# Patient Record
Sex: Male | Born: 1950 | Race: Black or African American | Hispanic: No | Marital: Single | Smoking: Current every day smoker
Health system: Southern US, Community
[De-identification: ages and names within clinical notes are randomized; demographics above are authoritative.]

## PROBLEM LIST (undated history)

## (undated) DIAGNOSIS — I10 Essential (primary) hypertension: Secondary | ICD-10-CM

## (undated) DIAGNOSIS — J449 Chronic obstructive pulmonary disease, unspecified: Secondary | ICD-10-CM

## (undated) DIAGNOSIS — D649 Anemia, unspecified: Secondary | ICD-10-CM

## (undated) DIAGNOSIS — K219 Gastro-esophageal reflux disease without esophagitis: Secondary | ICD-10-CM

## (undated) HISTORY — DX: Gastro-esophageal reflux disease without esophagitis: K21.9

## (undated) HISTORY — DX: Chronic obstructive pulmonary disease, unspecified: J44.9

## (undated) HISTORY — PX: COLONOSCOPY: SHX174

## (undated) HISTORY — PX: MULTIPLE TOOTH EXTRACTIONS: SHX2053

## (undated) HISTORY — DX: Anemia, unspecified: D64.9

---

## 1999-09-18 ENCOUNTER — Emergency Department (HOSPITAL_COMMUNITY): Admission: EM | Admit: 1999-09-18 | Discharge: 1999-09-18 | Payer: Self-pay | Admitting: Emergency Medicine

## 1999-09-18 ENCOUNTER — Encounter: Payer: Self-pay | Admitting: Emergency Medicine

## 1999-09-22 ENCOUNTER — Ambulatory Visit (HOSPITAL_BASED_OUTPATIENT_CLINIC_OR_DEPARTMENT_OTHER): Admission: RE | Admit: 1999-09-22 | Discharge: 1999-09-22 | Payer: Self-pay | Admitting: Oral Surgery

## 2000-02-01 ENCOUNTER — Ambulatory Visit (HOSPITAL_COMMUNITY): Admission: RE | Admit: 2000-02-01 | Discharge: 2000-02-01 | Payer: Self-pay | Admitting: Oral Surgery

## 2000-02-01 ENCOUNTER — Encounter: Payer: Self-pay | Admitting: Oral Surgery

## 2009-01-29 ENCOUNTER — Emergency Department (HOSPITAL_COMMUNITY): Admission: EM | Admit: 2009-01-29 | Discharge: 2009-01-29 | Payer: Self-pay | Admitting: Emergency Medicine

## 2011-03-28 LAB — COMPREHENSIVE METABOLIC PANEL
ALT: 28 U/L (ref 0–53)
AST: 31 U/L (ref 0–37)
Albumin: 3.7 g/dL (ref 3.5–5.2)
Alkaline Phosphatase: 84 U/L (ref 39–117)
BUN: 7 mg/dL (ref 6–23)
CO2: 30 mEq/L (ref 19–32)
Calcium: 8.9 mg/dL (ref 8.4–10.5)
Chloride: 102 mEq/L (ref 96–112)
Creatinine, Ser: 1.06 mg/dL (ref 0.4–1.5)
GFR calc Af Amer: >60 mL/min (ref >60)
GFR calc non Af Amer: >60 mL/min (ref >60)
Glucose, Bld: 104 mg/dL — ABNORMAL HIGH (ref 70–99)
Potassium: 4.3 mEq/L (ref 3.5–5.1)
Sodium: 140 mEq/L (ref 135–145)
Total Bilirubin: 0.5 mg/dL (ref 0.3–1.2)
Total Protein: 6.9 g/dL (ref 6.0–8.3)

## 2011-03-28 LAB — CBC
HCT: 40.5 % (ref 39.0–52.0)
Hemoglobin: 13.5 g/dL (ref 13.0–17.0)
MCHC: 33.3 g/dL (ref 30.0–36.0)
MCV: 91.5 fL (ref 78.0–100.0)
Platelets: 187 10*3/uL (ref 150–400)
RBC: 4.43 MIL/uL (ref 4.22–5.81)
RDW: 14.5 % (ref 11.5–15.5)
WBC: 6.9 10*3/uL (ref 4.0–10.5)

## 2011-03-28 LAB — DIFFERENTIAL
Basophils Absolute: 0 10*3/uL (ref 0.0–0.1)
Basophils Relative: 0 % (ref 0–1)
Lymphocytes Relative: 44 % (ref 12–46)
Neutro Abs: 3.2 10*3/uL (ref 1.7–7.7)

## 2011-03-28 LAB — URINALYSIS, ROUTINE W REFLEX MICROSCOPIC
Ketones, ur: NEGATIVE mg/dL
Nitrite: NEGATIVE
Specific Gravity, Urine: 1.014 (ref 1.005–1.030)
Urobilinogen, UA: 0.2 mg/dL (ref 0.0–1.0)
pH: 5.5 (ref 5.0–8.0)

## 2011-03-28 LAB — ETHANOL: Alcohol, Ethyl (B): 234 mg/dL — ABNORMAL HIGH (ref 0–10)

## 2013-03-27 ENCOUNTER — Encounter (HOSPITAL_COMMUNITY): Payer: Self-pay

## 2013-03-27 ENCOUNTER — Emergency Department (INDEPENDENT_AMBULATORY_CARE_PROVIDER_SITE_OTHER): Payer: Self-pay

## 2013-03-27 ENCOUNTER — Emergency Department (INDEPENDENT_AMBULATORY_CARE_PROVIDER_SITE_OTHER)
Admission: EM | Admit: 2013-03-27 | Discharge: 2013-03-27 | Disposition: A | Discharge status: Home / Self Care | Payer: Self-pay | Source: Home / Self Care | Attending: Family Medicine | Admitting: Family Medicine

## 2013-03-27 DIAGNOSIS — J410 Simple chronic bronchitis: Secondary | ICD-10-CM

## 2013-03-27 DIAGNOSIS — Z720 Tobacco use: Secondary | ICD-10-CM

## 2013-03-27 DIAGNOSIS — J40 Bronchitis, not specified as acute or chronic: Secondary | ICD-10-CM

## 2013-03-27 MED ORDER — DEXTROMETHORPHAN POLISTIREX 30 MG/5ML PO LQCR: 60.0000 mg | Freq: Two times a day (BID) | ORAL | Status: DC

## 2013-03-27 MED ORDER — DOXYCYCLINE HYCLATE 100 MG PO CAPS: 100.0000 mg | ORAL_CAPSULE | Freq: Two times a day (BID) | ORAL | Status: DC

## 2013-03-27 NOTE — ED Notes (Addendum)
Cough for 2 weeks; no improvement w OTC medications; yellow/green secretions

## 2013-03-27 NOTE — ED Provider Notes (Signed)
History     CSN: [id number]  Arrival date & time 03/27/13  1638   First MD Initiated Contact with Patient 03/27/13 1708      Chief Complaint  Patient presents with  . Cough    (Consider location/radiation/quality/duration/timing/severity/associated sxs/prior treatment) Patient is a 62 y.o. male presenting with cough. The history is provided by the patient and the spouse.  Cough Cough characteristics:  Productive Sputum characteristics:  Green and yellow Severity:  Moderate Onset quality:  Gradual Duration:  2 weeks Progression:  Unchanged Chronicity:  Recurrent Smoker: yes   Context: smoke exposure   Associated symptoms: shortness of breath   Associated symptoms: no chills, no fever, no rhinorrhea and no wheezing     History reviewed. No pertinent past medical history.  History reviewed. No pertinent past surgical history.  History reviewed. No pertinent family history.  History  Substance Use Topics  . Smoking status: Current Every Day Smoker  . Smokeless tobacco: Not on file  . Alcohol Use: Yes      Review of Systems  Constitutional: Negative for fever and chills.  HENT: Negative for rhinorrhea.   Respiratory: Positive for cough and shortness of breath. Negative for wheezing.   Cardiovascular: Negative.   Gastrointestinal: Negative.     Allergies  Review of patient's allergies indicates not on file.  Home Medications   Current Outpatient Rx  Name  Route  Sig  Dispense  Refill  . dextromethorphan (DELSYM) 30 MG/5ML liquid   Oral   Take 10 mLs (60 mg total) by mouth 2 (two) times daily. For cough   89 mL   1   . doxycycline (VIBRAMYCIN) 100 MG capsule   Oral   Take 1 capsule (100 mg total) by mouth 2 (two) times daily.   20 capsule   0     BP 163/98  Pulse 84  Temp(Src) 98.6 F (37 C) (Oral)  Resp 16  SpO2 95%  Physical Exam  Nursing note and vitals reviewed. Constitutional: He is oriented to person, place, and time. He appears  well-developed and well-nourished.  HENT:  Head: Normocephalic.  Right Ear: External ear normal.  Left Ear: External ear normal.  Mouth/Throat: Oropharynx is clear and moist.  Eyes: Pupils are equal, round, and reactive to light.  Neck: Normal range of motion. Neck supple.  Cardiovascular: Normal rate and regular rhythm.   Pulmonary/Chest: He has decreased breath sounds. He has wheezes in the right upper field. He has rhonchi.  Lymphadenopathy:    He has no cervical adenopathy.  Neurological: He is alert and oriented to person, place, and time.  Skin: Skin is warm and dry.    ED Course  Procedures (including critical care time)  Labs Reviewed - No data to display Dg Chest 2 View  03/27/2013  *RADIOLOGY REPORT*  Clinical Data: Cough for 2 weeks, smoking history, shortness of  CHEST - 2 VIEW  Comparison: Chest x-ray of 01/29/2009  Findings: Slightly more prominent right hilum most likely is due to rotation, with no abnormality noted on the lateral view.  Follow-up chest x-ray is recommended.  No focal infiltrate or effusion is seen.  Mediastinal contours are stable.  The heart is within upper limits normal.  There are degenerative changes throughout the thoracic spine.  IMPRESSION:  No definite active process.  Slightly more prominent right hilum most likely rotational.  Consider follow-up chest x-ray if warranted clinically.   Original Report Authenticated By: Dwyane Dee, M.D.      1.  Bronchitis due to tobacco use       MDM  X-rays reviewed and report per radiologist.         Linna Hoff, MD 03/27/13 2020

## 2018-01-09 ENCOUNTER — Emergency Department (HOSPITAL_COMMUNITY)
Admission: EM | Admit: 2018-01-09 | Discharge: 2018-01-09 | Disposition: A | Discharge status: Home / Self Care | Payer: Commercial Managed Care - HMO | Attending: Emergency Medicine | Admitting: Emergency Medicine

## 2018-01-09 ENCOUNTER — Encounter (HOSPITAL_COMMUNITY): Payer: Self-pay

## 2018-01-09 ENCOUNTER — Other Ambulatory Visit: Payer: Self-pay

## 2018-01-09 ENCOUNTER — Emergency Department (HOSPITAL_COMMUNITY): Payer: Commercial Managed Care - HMO

## 2018-01-09 DIAGNOSIS — I10 Essential (primary) hypertension: Secondary | ICD-10-CM | POA: Insufficient documentation

## 2018-01-09 DIAGNOSIS — F17200 Nicotine dependence, unspecified, uncomplicated: Secondary | ICD-10-CM | POA: Diagnosis not present

## 2018-01-09 DIAGNOSIS — R1013 Epigastric pain: Secondary | ICD-10-CM | POA: Insufficient documentation

## 2018-01-09 HISTORY — DX: Essential (primary) hypertension: I10

## 2018-01-09 LAB — CBC
HCT: 31.5 % — ABNORMAL LOW (ref 39.0–52.0)
Hemoglobin: 10.1 g/dL — ABNORMAL LOW (ref 13.0–17.0)
MCH: 30.8 pg (ref 26.0–34.0)
MCHC: 32.1 g/dL (ref 30.0–36.0)
MCV: 96 fL (ref 78.0–100.0)
Platelets: 289 10*3/uL (ref 150–400)
RBC: 3.28 MIL/uL — AB (ref 4.22–5.81)
RDW: 21.8 % — ABNORMAL HIGH (ref 11.5–15.5)
WBC: 6.9 10*3/uL (ref 4.0–10.5)

## 2018-01-09 LAB — COMPREHENSIVE METABOLIC PANEL
ALK PHOS: 73 U/L (ref 38–126)
ALT: 17 U/L (ref 17–63)
ANION GAP: 8 (ref 5–15)
AST: 22 U/L (ref 15–41)
Albumin: 3.8 g/dL (ref 3.5–5.0)
BILIRUBIN TOTAL: 1.3 mg/dL — AB (ref 0.3–1.2)
BUN: 5 mg/dL — ABNORMAL LOW (ref 6–20)
CALCIUM: 8.9 mg/dL (ref 8.9–10.3)
CO2: 25 mmol/L (ref 22–32)
Chloride: 105 mmol/L (ref 101–111)
Creatinine, Ser: 0.8 mg/dL (ref 0.61–1.24)
Glucose, Bld: 99 mg/dL (ref 65–99)
Potassium: 4.2 mmol/L (ref 3.5–5.1)
Sodium: 138 mmol/L (ref 135–145)
TOTAL PROTEIN: 6.9 g/dL (ref 6.5–8.1)

## 2018-01-09 LAB — URINALYSIS, ROUTINE W REFLEX MICROSCOPIC
BILIRUBIN URINE: NEGATIVE
Glucose, UA: NEGATIVE mg/dL
Hgb urine dipstick: NEGATIVE
Ketones, ur: NEGATIVE mg/dL
LEUKOCYTES UA: NEGATIVE
NITRITE: NEGATIVE
Protein, ur: NEGATIVE mg/dL
Specific Gravity, Urine: 1.02 (ref 1.005–1.030)
pH: 5 (ref 5.0–8.0)

## 2018-01-09 LAB — LIPASE, BLOOD: Lipase: 32 U/L (ref 11–51)

## 2018-01-09 MED ORDER — GI COCKTAIL ~~LOC~~
30.0000 mL | Freq: Once | ORAL | Status: AC
  Administered 2018-01-09: 30 mL via ORAL
  Filled 2018-01-09: qty 30

## 2018-01-09 NOTE — Discharge Instructions (Signed)
Please read instructions below. Schedule an appointment with the gastroenterologist to follow-up on your ongoing symptoms. Begin taking Prilosec daily again. Avoid spicy, acidic, fatty, and greasy foods, alcohol, and medications such as Advil/ibuprofen, aspirin, Goody's Powder, Aleve. Do not lay down for at least 45 minutes following a meal. Return to ER for new or concerning symptoms.

## 2018-01-09 NOTE — ED Triage Notes (Signed)
Pt reports RUQ, LUQ abdominal pain x 3 days. Denies n/v/diarrhea. Endorses constipation at times but last BM yesterday. Reports pain is not relieved by antiacids.

## 2018-01-09 NOTE — ED Notes (Signed)
Pt states he would prefer to wait for IV. Will inform provider

## 2018-01-09 NOTE — ED Notes (Signed)
Patient transported to Ultrasound 

## 2018-01-09 NOTE — ED Provider Notes (Signed)
[facility] Provider Note   CSN: [id number] Arrival date & time: 01/09/18  1119     History   Chief Complaint Chief Complaint  Patient presents with  . Abdominal Pain    HPI Vincent Marquez is a 67 y.o. male with past medical history of hypertension, presenting to the ED for multiple months of worsening epigastric abdominal pain.  Patient states he has been told in the past that he has GERD years ago, which she has been treating with Prilosec over-the-counter.  He states the past few months symptoms have been worsening, described as a burning and sharp pain in his upper abdomen that radiates around to his back.  Patient states symptoms are exacerbated by greasy and spicy meals, and alcohol intake.  He also states symptoms are worse when laying flat, and it has been keeping him up the past few nights.  Reports associated flatulence.  Denies nausea, vomiting, diarrhea, fever, chills, urinary symptoms, or other complaints. No PCP. Has not seen GI specialist for sx.  The history is provided by the patient.    Past Medical History:  Diagnosis Date  . Hypertension     There are no active problems to display for this patient.   History reviewed. No pertinent surgical history.     Home Medications    Prior to Admission medications   Medication Sig Start Date End Date Taking? Authorizing Provider  Alpha-D-Galactosidase Satira Mccallum) TABS Take 4 tablets by mouth daily.   Yes [provider]  Cal Carb-Mag Hydrox-Simeth (ROLAIDS ADVANCED) 1000-200-40 MG CHEW Chew 1-2 tablets by mouth 4 (four) times daily as needed (for acid indigestion).   Yes [provider]  calcium carbonate (TUMS - DOSED IN MG ELEMENTAL CALCIUM) 500 MG chewable tablet Chew 1-2 tablets by mouth as needed for indigestion or heartburn.   Yes [provider]  omeprazole (PRILOSEC OTC) 20 MG tablet Take 20 mg by mouth 2 (two) times daily as needed (for acid  indigestion).    Yes [provider]  simethicone (GAS-X EXTRA STRENGTH) 125 MG chewable tablet Chew 125 mg by mouth every 6 (six) hours as needed for flatulence.   Yes [provider]  Sodium Bicarbonate POWD Mix 1-2 teaspoonsful into 6 ounces of water and drink as needed for indigestion   Yes [provider]  dextromethorphan (DELSYM) 30 MG/5ML liquid Take 10 mLs (60 mg total) by mouth 2 (two) times daily. For cough Patient not taking: Reported on 01/09/2018 03/27/13   Billy Fischer, MD  doxycycline (VIBRAMYCIN) 100 MG capsule Take 1 capsule (100 mg total) by mouth 2 (two) times daily. Patient not taking: Reported on 01/09/2018 03/27/13   Billy Fischer, MD    Family History No family history on file.  Social History Social History   Tobacco Use  . Smoking status: Current Every Day Smoker  . Smokeless tobacco: Never Used  Substance Use Topics  . Alcohol use: Yes    Frequency: Never  . Drug use: No     Allergies   Patient has no known allergies.   Review of Systems Review of Systems  Constitutional: Negative for appetite change and fever.  Respiratory: Negative for shortness of breath.   Cardiovascular: Negative for chest pain.  Gastrointestinal: Positive for abdominal pain. Negative for constipation, diarrhea, nausea and vomiting.  Genitourinary: Negative for dysuria and frequency.  All other systems reviewed and are negative.    Physical Exam Updated Vital Signs BP (!) 134/97  Pulse 83   Temp 98.3 F (36.8 C) (Oral)   Resp 16   Ht 5\' 7"  (1.702 m)   Wt 79.4 kg (175 lb)   SpO2 100%   BMI 27.41 kg/m   Physical Exam  Constitutional: He appears well-developed and well-nourished. He does not appear ill. No distress.  HENT:  Head: Normocephalic and atraumatic.  Mouth/Throat: Oropharynx is clear and moist.  Eyes: Conjunctivae are normal.  Cardiovascular: Normal rate, regular rhythm, normal heart sounds and intact distal pulses.    Pulmonary/Chest: Effort normal and breath sounds normal.  Abdominal: Soft. Normal appearance and bowel sounds are normal. He exhibits no distension and no mass (no pulsatile masses). There is tenderness in the right upper quadrant, epigastric area and left upper quadrant. There is no rigidity, no rebound, no guarding and negative Murphy's sign.  Neurological: He is alert.  Skin: Skin is warm.  Psychiatric: He has a normal mood and affect. His behavior is normal.  Nursing note and vitals reviewed.    ED Treatments / Results  Labs (all labs ordered are listed, but only abnormal results are displayed) Labs Reviewed  COMPREHENSIVE METABOLIC PANEL - Abnormal; Notable for the following components:      Result Value   BUN 5 (*)    Total Bilirubin 1.3 (*)    All other components within normal limits  CBC - Abnormal; Notable for the following components:   RBC 3.28 (*)    Hemoglobin 10.1 (*)    HCT 31.5 (*)    RDW 21.8 (*)    All other components within normal limits  LIPASE, BLOOD  URINALYSIS, ROUTINE W REFLEX MICROSCOPIC    EKG  EKG Interpretation None       Radiology US Abdomen Limited Ruq  Result Date: 01/09/2018 CLINICAL DATA:  Epigastric pain EXAM: ULTRASOUND ABDOMEN LIMITED RIGHT UPPER QUADRANT COMPARISON:  None. FINDINGS: Gallbladder: Contracted. The patient was not NPO for the study. No visible stones or sonographic Murphy sign. Gallbladder wall borderline in thickness at 3 mm, likely related to contracted state Common bile duct: Diameter: Normal caliber, 3 mm Liver: No focal lesion identified. Within normal limits in parenchymal echogenicity. Portal vein is patent on color Doppler imaging with normal direction of blood flow towards the liver. IMPRESSION: Unremarkable right upper quadrant ultrasound. Electronically Signed   By: Rolm Baptise M.D.   On: 01/09/2018 18:19    Procedures Procedures (including critical care time)  Medications Ordered in ED Medications  gi  cocktail (Maalox,Lidocaine,Donnatal) (30 mLs Oral Given 01/09/18 1734)     Initial Impression / Assessment and Plan / ED Course  I have reviewed the triage vital signs and the nursing notes.  Pertinent labs & imaging results that were available during my care of the patient were reviewed by me and considered in my medical decision making (see chart for details).     Patient with past medical history of GERD, presenting to the ED for worsening symptoms.  Describes symptoms as postprandial, intermittently sharp epigastric abdominal pain, and reflux that is worse when laying flat at night.  Abdominal exam without peritoneal signs, mild epigastric and bilateral upper quadrant tenderness, negative Murphy's.  Labs unremarkable.  Right upper quadrant ultrasound done and neg for cholelithiasis.  Suspect symptoms are secondary to GERD versus gastritis.  GI cocktail given in ED, and patient reported significant relief of symptoms.  Discussed diet modifications, and recommend patient begin taking Prilosec daily.  Also provided GI referral for evaluation of ongoing symptoms.  Well-Appearing,  not distressed, safe for discharge at this time.  Patient discussed with Dr. Ralene Bathe.  Discussed results, findings, treatment and follow up. Patient advised of return precautions. Patient verbalized understanding and agreed with plan.  Final Clinical Impressions(s) / ED Diagnoses   Final diagnoses:  Epigastric abdominal pain    ED Discharge Orders    None       Robinson, [place] N, PA-C 01/10/18 0102    Quintella Reichert, MD 01/10/18 [phone]

## 2019-01-15 ENCOUNTER — Ambulatory Visit (INDEPENDENT_AMBULATORY_CARE_PROVIDER_SITE_OTHER): Payer: Commercial Managed Care - HMO

## 2019-01-15 ENCOUNTER — Ambulatory Visit (HOSPITAL_COMMUNITY)
Admission: EM | Admit: 2019-01-15 | Discharge: 2019-01-15 | Disposition: A | Discharge status: Home / Self Care | Payer: Commercial Managed Care - HMO | Attending: Emergency Medicine | Admitting: Emergency Medicine

## 2019-01-15 ENCOUNTER — Encounter (HOSPITAL_COMMUNITY): Payer: Self-pay | Admitting: Emergency Medicine

## 2019-01-15 DIAGNOSIS — S2231XA Fracture of one rib, right side, initial encounter for closed fracture: Secondary | ICD-10-CM | POA: Diagnosis present

## 2019-01-15 MED ORDER — HYDROCODONE-ACETAMINOPHEN 5-325 MG PO TABS: 1.0000 | ORAL_TABLET | Freq: Four times a day (QID) | ORAL | 0 refills | Status: DC | PRN

## 2019-01-15 MED ORDER — IBUPROFEN 600 MG PO TABS: 600.0000 mg | ORAL_TABLET | Freq: Four times a day (QID) | ORAL | 0 refills | Status: DC | PRN

## 2019-01-15 MED ORDER — AEROCHAMBER PLUS MISC: 2 refills | Status: DC

## 2019-01-15 MED ORDER — ALBUTEROL SULFATE HFA 108 (90 BASE) MCG/ACT IN AERS: 1.0000 | INHALATION_SPRAY | Freq: Four times a day (QID) | RESPIRATORY_TRACT | 0 refills | Status: AC | PRN

## 2019-01-15 NOTE — ED Notes (Signed)
Patient able to ambulate independently  

## 2019-01-15 NOTE — ED Provider Notes (Signed)
HPI  SUBJECTIVE:  Vincent Marquez is a 68 y.o. male who presents with 2 days of sharp, throbbing, stabbing right anterior rib pain after leaning forward to pick up his girlfriend.  States that he heard a "pop".  He reports worsening coughing, wheezing, dyspnea on exertion secondary to pain.  He states that his baseline shortness of breath is getting better.  Denies any direct trauma to the chest.  No fevers, hemoptysis.  No antipyretic in the past 4 to 6 hours.  He has tried 800 mg of ibuprofen twice daily with some improvement in symptoms.  Symptoms are worse with coughing, inspiration, torso rotation, sneezing.  He has a past medical history of hypertension, for 40-year pack history of smoking.  No history of pneumothorax, asthma, emphysema, COPD, although he is on an inhaled steroid.  No history of diabetes.  PMD: Dr. Synthia Innocent.   Past Medical History:  Diagnosis Date  . Hypertension     History reviewed. No pertinent surgical history.  History reviewed. No pertinent family history.  Social History   Tobacco Use  . Smoking status: Current Every Day Smoker  . Smokeless tobacco: Never Used  Substance Use Topics  . Alcohol use: Yes    Frequency: Never  . Drug use: No    No current facility-administered medications for this encounter.   Current Outpatient Medications:  .  Fluticasone-Umeclidin-Vilant (TRELEGY ELLIPTA) 100-62.5-25 MCG/INH AEPB, Inhale into the lungs., Disp: , Rfl:  .  ondansetron (ZOFRAN) 4 MG tablet, Take 4 mg by mouth every 8 (eight) hours as needed for nausea or vomiting., Disp: , Rfl:  .  albuterol (PROVENTIL HFA;VENTOLIN HFA) 108 (90 Base) MCG/ACT inhaler, Inhale 1-2 puffs into the lungs every 6 (six) hours as needed for wheezing or shortness of breath., Disp: 1 Inhaler, Rfl: 0 .  HYDROcodone-acetaminophen (NORCO/VICODIN) 5-325 MG tablet, Take 1-2 tablets by mouth every 6 (six) hours as needed for moderate pain or severe pain., Disp: 12 tablet, Rfl: 0 .   ibuprofen (ADVIL,MOTRIN) 600 MG tablet, Take 1 tablet (600 mg total) by mouth every 6 (six) hours as needed., Disp: 30 tablet, Rfl: 0 .  omeprazole (PRILOSEC OTC) 20 MG tablet, Take 20 mg by mouth 2 (two) times daily as needed (for acid indigestion). , Disp: , Rfl:  .  Spacer/Aero-Holding Chambers (AEROCHAMBER PLUS) inhaler, Use as instructed, Disp: 1 each, Rfl: 2  No Known Allergies   ROS  As noted in HPI.   Physical Exam  BP (!) 166/89 (BP Location: Left Arm)   Pulse 81   Temp 97.8 F (36.6 C) (Oral)   Resp 18   SpO2 96%   Constitutional: Well developed, well nourished, no acute distress Eyes:  EOMI, conjunctiva normal bilaterally HENT: Normocephalic, atraumatic,mucus membranes moist Respiratory: Normal inspiratory effort.  Lungs clear bilaterally.  Positive tenderness to lower anterior ribs and along the midaxillary line.  No bruising.  No paradoxical chest wall motion.  Patient able to take a full deep breath in. Cardiovascular: Normal rate regular rhythm no murmurs rubs or gallops GI: nondistended skin: No rash, skin intact Musculoskeletal: no deformities Neurologic: Alert & oriented x 3, no focal neuro deficits Psychiatric: Speech and behavior appropriate   ED Course   Medications - No data to display  Orders Placed This Encounter  Procedures  . DG Ribs Unilateral W/Chest Right    Standing Status:   Standing    Number of Occurrences:   1    Order Specific Question:   Reason  for Exam (SYMPTOM  OR DIAGNOSIS REQUIRED)    Answer:   tenderness anterior chest wall r/o fx, ptx, pna  . Incentive spirometry RT    Standing Status:   Standing    Number of Occurrences:   1    No results found for this or any previous visit (from the past 24 hour(s)). Dg Ribs Unilateral W/chest Right  Result Date: 01/15/2019 CLINICAL DATA:  Right anterior rib pain and tenderness of the anterior chest wall. EXAM: RIGHT RIBS AND CHEST - 3+ VIEW COMPARISON:  03/27/2013 and 01/29/2009  FINDINGS: There are multiple old right rib fractures including the anterior aspects of the right seventh eighth and ninth ribs and the posterior aspects of the right fifth and sixth ribs. There is area of cortical disruption of the anterolateral aspect of the right fifth rib which could be acute. Heart size and vascularity are normal. Lungs are clear. Soft tissue prominence at the right lung apex medially is felt to be tortuous brachiocephalic vessels, unchanged since 2010. IMPRESSION: 1. Multiple old right rib fractures. Possible hairline fracture of the anterolateral aspect of the right fifth rib although this is well above the marked area of tenderness. Electronically Signed   By: Lorriane Shire M.D.   On: 01/15/2019 10:49    ED Clinical Impression  Closed fracture of one rib of right side, initial encounter   ED Assessment/Plan  Checking rib series.  [place] Narcotic database reviewed for this patient, and feel that the risk/benefit ratio today is favorable for proceeding with a prescription for controlled substance.  No opiate prescriptions in 2 years.  Reviewed imaging independently.  Multiple old rib fractures.  Possible hairline fracture of the right fifth rib.  No pneumothorax.   see radiology report for full details.  Home with ibuprofen and a Tylenol containing product 3 or 4 times a day as needed for pain.  Ibuprofen with 1 g of Tylenol for mild to moderate pain, ibuprofen with Norco for severe pain.  Will send home with an albuterol inhaler with a spacer.  Also incentive spirometer.  He will need to follow-up with his primary care physician in several days if not getting any better, to the ER if he gets worse.  Discussed labs, imaging, MDM, treatment plan, and plan for follow-up with patient. Discussed sn/sx that should prompt return to the ED. patient agrees with plan.   Meds ordered this encounter  Medications  . HYDROcodone-acetaminophen (NORCO/VICODIN) 5-325 MG tablet    Sig: Take  1-2 tablets by mouth every 6 (six) hours as needed for moderate pain or severe pain.    Dispense:  12 tablet    Refill:  0  . ibuprofen (ADVIL,MOTRIN) 600 MG tablet    Sig: Take 1 tablet (600 mg total) by mouth every 6 (six) hours as needed.    Dispense:  30 tablet    Refill:  0  . albuterol (PROVENTIL HFA;VENTOLIN HFA) 108 (90 Base) MCG/ACT inhaler    Sig: Inhale 1-2 puffs into the lungs every 6 (six) hours as needed for wheezing or shortness of breath.    Dispense:  1 Inhaler    Refill:  0  . Spacer/Aero-Holding Chambers (AEROCHAMBER PLUS) inhaler    Sig: Use as instructed    Dispense:  1 each    Refill:  2    *This clinic note was created using Dragon dictation software. Therefore, there may be occasional mistakes despite careful proofreading.   ?   Melynda Ripple, MD 01/16/19 [phone]

## 2019-01-15 NOTE — ED Triage Notes (Signed)
Pt presents to [facility] for assessment of right rib pain after he felt a "pop" on Friday while dancing with his partner.  States pain has not been relieved by OTC medicines.  States hx of 40+ years of smoking

## 2019-01-15 NOTE — Discharge Instructions (Addendum)
600 mg of ibuprofen and a Tylenol containing product 3 or 4 times a day as needed for pain.  Ibuprofen with 1 g of regularTylenol for mild to moderate pain, ibuprofen with 1-2 Norco for severe pain.  Do not take the Norco if you are taking Tylenol, as they both have Tylenol in them and too much Tylenol can hurt your liver.  2 puffs from your albuterol using your spacer every 4-6 hours as needed for coughing, wheezing.  Use the incentive spirometer 3 times an hour.  This will help keep your lungs open and prevent a pneumonia.

## 2019-09-02 ENCOUNTER — Other Ambulatory Visit: Payer: Self-pay | Admitting: Gastroenterology

## 2019-09-02 DIAGNOSIS — R1010 Upper abdominal pain, unspecified: Secondary | ICD-10-CM

## 2019-09-12 ENCOUNTER — Ambulatory Visit
Admission: RE | Admit: 2019-09-12 | Discharge: 2019-09-12 | Disposition: A | Discharge status: Home / Self Care | Payer: Medicare HMO | Source: Ambulatory Visit | Attending: Gastroenterology | Admitting: Gastroenterology

## 2019-09-12 DIAGNOSIS — R1010 Upper abdominal pain, unspecified: Secondary | ICD-10-CM

## 2019-12-24 ENCOUNTER — Encounter: Payer: Self-pay | Admitting: Physician Assistant

## 2020-01-01 ENCOUNTER — Ambulatory Visit: Payer: Medicare HMO | Admitting: Physician Assistant

## 2020-01-22 ENCOUNTER — Encounter: Payer: Self-pay | Admitting: Internal Medicine

## 2020-03-10 ENCOUNTER — Encounter: Payer: Self-pay | Admitting: Internal Medicine

## 2021-01-07 DIAGNOSIS — Z01812 Encounter for preprocedural laboratory examination: Secondary | ICD-10-CM | POA: Diagnosis not present

## 2021-01-11 DIAGNOSIS — E663 Overweight: Secondary | ICD-10-CM | POA: Diagnosis not present

## 2021-01-11 DIAGNOSIS — Z791 Long term (current) use of non-steroidal anti-inflammatories (NSAID): Secondary | ICD-10-CM | POA: Diagnosis not present

## 2021-01-11 DIAGNOSIS — K219 Gastro-esophageal reflux disease without esophagitis: Secondary | ICD-10-CM | POA: Diagnosis not present

## 2021-01-11 DIAGNOSIS — N400 Enlarged prostate without lower urinary tract symptoms: Secondary | ICD-10-CM | POA: Diagnosis not present

## 2021-01-11 DIAGNOSIS — K259 Gastric ulcer, unspecified as acute or chronic, without hemorrhage or perforation: Secondary | ICD-10-CM | POA: Diagnosis not present

## 2021-01-11 DIAGNOSIS — Z720 Tobacco use: Secondary | ICD-10-CM | POA: Diagnosis not present

## 2021-01-11 DIAGNOSIS — R030 Elevated blood-pressure reading, without diagnosis of hypertension: Secondary | ICD-10-CM | POA: Diagnosis not present

## 2021-01-11 DIAGNOSIS — Z8249 Family history of ischemic heart disease and other diseases of the circulatory system: Secondary | ICD-10-CM | POA: Diagnosis not present

## 2021-01-11 DIAGNOSIS — G8929 Other chronic pain: Secondary | ICD-10-CM | POA: Diagnosis not present

## 2021-01-12 DIAGNOSIS — K635 Polyp of colon: Secondary | ICD-10-CM | POA: Diagnosis not present

## 2021-01-12 DIAGNOSIS — D509 Iron deficiency anemia, unspecified: Secondary | ICD-10-CM | POA: Diagnosis not present

## 2021-01-12 DIAGNOSIS — K621 Rectal polyp: Secondary | ICD-10-CM | POA: Diagnosis not present

## 2021-01-12 DIAGNOSIS — K2930 Chronic superficial gastritis without bleeding: Secondary | ICD-10-CM | POA: Diagnosis not present

## 2021-01-12 DIAGNOSIS — K5520 Angiodysplasia of colon without hemorrhage: Secondary | ICD-10-CM | POA: Diagnosis not present

## 2021-01-12 DIAGNOSIS — K648 Other hemorrhoids: Secondary | ICD-10-CM | POA: Diagnosis not present

## 2021-01-12 DIAGNOSIS — D123 Benign neoplasm of transverse colon: Secondary | ICD-10-CM | POA: Diagnosis not present

## 2021-01-12 DIAGNOSIS — Z1211 Encounter for screening for malignant neoplasm of colon: Secondary | ICD-10-CM | POA: Diagnosis not present

## 2021-01-12 DIAGNOSIS — D125 Benign neoplasm of sigmoid colon: Secondary | ICD-10-CM | POA: Diagnosis not present

## 2021-01-12 DIAGNOSIS — K2951 Unspecified chronic gastritis with bleeding: Secondary | ICD-10-CM | POA: Diagnosis not present

## 2021-01-12 DIAGNOSIS — R1013 Epigastric pain: Secondary | ICD-10-CM | POA: Diagnosis not present

## 2021-01-17 DIAGNOSIS — D123 Benign neoplasm of transverse colon: Secondary | ICD-10-CM | POA: Diagnosis not present

## 2021-01-17 DIAGNOSIS — K2951 Unspecified chronic gastritis with bleeding: Secondary | ICD-10-CM | POA: Diagnosis not present

## 2021-01-17 DIAGNOSIS — K621 Rectal polyp: Secondary | ICD-10-CM | POA: Diagnosis not present

## 2021-01-17 DIAGNOSIS — D125 Benign neoplasm of sigmoid colon: Secondary | ICD-10-CM | POA: Diagnosis not present

## 2021-01-17 DIAGNOSIS — K635 Polyp of colon: Secondary | ICD-10-CM | POA: Diagnosis not present

## 2021-01-18 ENCOUNTER — Ambulatory Visit: Payer: Medicare HMO | Admitting: Medical

## 2021-03-09 DIAGNOSIS — Z20822 Contact with and (suspected) exposure to covid-19: Secondary | ICD-10-CM | POA: Diagnosis not present

## 2021-05-18 ENCOUNTER — Ambulatory Visit: Payer: Medicare HMO | Admitting: Family Medicine

## 2021-06-14 ENCOUNTER — Ambulatory Visit (INDEPENDENT_AMBULATORY_CARE_PROVIDER_SITE_OTHER): Payer: Medicare HMO | Admitting: Primary Care

## 2021-07-19 ENCOUNTER — Other Ambulatory Visit: Payer: Self-pay | Admitting: Family Medicine

## 2021-07-19 DIAGNOSIS — N5089 Other specified disorders of the male genital organs: Secondary | ICD-10-CM

## 2021-07-25 ENCOUNTER — Other Ambulatory Visit: Payer: Medicare Other

## 2021-08-05 ENCOUNTER — Other Ambulatory Visit: Payer: Medicare Other

## 2021-08-11 ENCOUNTER — Ambulatory Visit
Admission: RE | Admit: 2021-08-11 | Discharge: 2021-08-11 | Disposition: A | Discharge status: Home / Self Care | Payer: Medicare Other | Source: Ambulatory Visit | Attending: Family Medicine | Admitting: Family Medicine

## 2021-08-11 DIAGNOSIS — N5089 Other specified disorders of the male genital organs: Secondary | ICD-10-CM

## 2021-09-21 ENCOUNTER — Other Ambulatory Visit: Payer: Self-pay | Admitting: Student

## 2021-09-21 DIAGNOSIS — R109 Unspecified abdominal pain: Secondary | ICD-10-CM

## 2021-09-21 DIAGNOSIS — K219 Gastro-esophageal reflux disease without esophagitis: Secondary | ICD-10-CM

## 2021-09-28 ENCOUNTER — Other Ambulatory Visit: Payer: Self-pay | Admitting: Student

## 2021-09-28 DIAGNOSIS — R109 Unspecified abdominal pain: Secondary | ICD-10-CM

## 2021-10-17 ENCOUNTER — Ambulatory Visit
Admission: RE | Admit: 2021-10-17 | Discharge: 2021-10-17 | Disposition: A | Discharge status: Home / Self Care | Payer: Medicare Other | Source: Ambulatory Visit | Attending: Student | Admitting: Student

## 2021-10-17 DIAGNOSIS — K219 Gastro-esophageal reflux disease without esophagitis: Secondary | ICD-10-CM

## 2021-10-17 DIAGNOSIS — R109 Unspecified abdominal pain: Secondary | ICD-10-CM

## 2021-10-17 MED ORDER — IOPAMIDOL (ISOVUE-300) INJECTION 61%
100.0000 mL | Freq: Once | INTRAVENOUS | Status: AC | PRN
  Administered 2021-10-17: 100 mL via INTRAVENOUS

## 2021-11-15 ENCOUNTER — Other Ambulatory Visit: Payer: Self-pay

## 2021-11-15 ENCOUNTER — Ambulatory Visit
Admission: RE | Admit: 2021-11-15 | Discharge: 2021-11-15 | Disposition: A | Discharge status: Home / Self Care | Payer: Medicare Other | Source: Ambulatory Visit | Attending: Student | Admitting: Student

## 2021-11-15 DIAGNOSIS — R109 Unspecified abdominal pain: Secondary | ICD-10-CM

## 2021-11-15 MED ORDER — GADOBENATE DIMEGLUMINE 529 MG/ML IV SOLN
15.0000 mL | Freq: Once | INTRAVENOUS | Status: AC | PRN
  Administered 2021-11-15: 15 mL via INTRAVENOUS

## 2022-03-27 ENCOUNTER — Encounter: Payer: Self-pay | Admitting: General Surgery

## 2022-03-29 ENCOUNTER — Encounter: Payer: Self-pay | Admitting: Gastroenterology

## 2022-04-13 ENCOUNTER — Encounter: Payer: Self-pay | Admitting: Gastroenterology

## 2022-04-13 ENCOUNTER — Ambulatory Visit (INDEPENDENT_AMBULATORY_CARE_PROVIDER_SITE_OTHER): Payer: 59 | Admitting: Gastroenterology

## 2022-04-13 VITALS — BP 100/60 | HR 96 | Ht 66.0 in | Wt 176.8 lb

## 2022-04-13 DIAGNOSIS — R933 Abnormal findings on diagnostic imaging of other parts of digestive tract: Secondary | ICD-10-CM | POA: Insufficient documentation

## 2022-04-13 DIAGNOSIS — G8929 Other chronic pain: Secondary | ICD-10-CM

## 2022-04-13 DIAGNOSIS — R1013 Epigastric pain: Secondary | ICD-10-CM

## 2022-04-13 DIAGNOSIS — R1010 Upper abdominal pain, unspecified: Secondary | ICD-10-CM | POA: Insufficient documentation

## 2022-04-13 NOTE — Patient Instructions (Signed)
If you are age 69 or older, your body mass index should be between 23-30. Your Body mass index is 28.54 kg/m?Marland Kitchen If this is out of the aforementioned range listed, please consider follow up with your Primary Care Provider. ? ?If you are age 38 or younger, your body mass index should be between 19-25. Your Body mass index is 28.54 kg/m?Marland Kitchen If this is out of the aformentioned range listed, please consider follow up with your Primary Care Provider.  ? ?________________________________________________________ ? ?The [facility] providers would like to encourage you to use [facility] to communicate with providers for non-urgent requests or questions.  Due to long hold times on the telephone, sending your provider a message by [facility] may be a faster and more efficient way to get a response.  Please allow 48 business hours for a response.  Please remember that this is for non-urgent requests.  ?_______________________________________________________ ? ?You have been scheduled for an endoscopy. Please follow written instructions given to you at your visit today. ?If you use inhalers (even only as needed), please bring them with you on the day of your procedure. ? ?It was a pleasure to see you today! ? ?Thank you for trusting me with your gastrointestinal care!   ? ? ?

## 2022-04-13 NOTE — Progress Notes (Signed)
? ? ? ?04/13/2022 ?Vincent Marquez ?[id number] ?May 13, 1951 ? ? ?HISTORY OF PRESENT ILLNESS: This is a 71 year old male who is new to our office.  Says that he has been referred here by Dr. Manuella Ghazi for evaluation of GERD and anemia.  He tells me that he is actually having epigastric abdominal pain that has been present for about the past 3 years, but worsening.  He says that it causes him to not be able to sleep.  Says that sometimes he cannot eat.  Says that he has to sit up for a couple of hours.  He is on pantoprazole 40 mg daily for the past year in regards to the anemia, I do not have any recent labs.  Last hemoglobin I have is from 4 years ago at which time is 10.5 g with a normal MCV.  He denies any black or bloody stools.  Tells me had a colonoscopy probably about a year ago at [place].  He said that they would not see him back again because he missed some appointments.  He denies nausea and vomiting.  He does use some NSAIDs, cannot quantify how much, but not daily use.  He denies dysphagia.  He does not recall ever having an endoscopy. ? ?CT scan of the abdomen with contrast in November 2022 showed mild symmetric wall thickening of the gastric antrum possibly reflecting gastritis. ? ? ?Past Medical History:  ?Diagnosis Date  ? Anemia   ? GERD (gastroesophageal reflux disease)   ? Hypertension   ? ?Past Surgical History:  ?Procedure Laterality Date  ? MULTIPLE TOOTH EXTRACTIONS    ? ? reports that he has been smoking. He has never used smokeless tobacco. He reports current alcohol use. He reports that he does not use drugs. ?family history includes Other in his sister and sister. ?No Known Allergies ? ?  ?Outpatient Encounter Medications as of 04/13/2022  ?Medication Sig  ? albuterol (PROVENTIL HFA;VENTOLIN HFA) 108 (90 Base) MCG/ACT inhaler Inhale 1-2 puffs into the lungs every 6 (six) hours as needed for wheezing or shortness of breath.  ? Fluticasone-Umeclidin-Vilant 100-62.5-25 MCG/INH AEPB Inhale into the lungs.   ? pantoprazole (PROTONIX) 40 MG tablet Take 40 mg by mouth every morning.  ? [DISCONTINUED] HYDROcodone-acetaminophen (NORCO/VICODIN) 5-325 MG tablet Take 1-2 tablets by mouth every 6 (six) hours as needed for moderate pain or severe pain.  ? [DISCONTINUED] ibuprofen (ADVIL,MOTRIN) 600 MG tablet Take 1 tablet (600 mg total) by mouth every 6 (six) hours as needed.  ? [DISCONTINUED] omeprazole (PRILOSEC OTC) 20 MG tablet Take 20 mg by mouth 2 (two) times daily as needed (for acid indigestion).   ? [DISCONTINUED] ondansetron (ZOFRAN) 4 MG tablet Take 4 mg by mouth every 8 (eight) hours as needed for nausea or vomiting.  ? [DISCONTINUED] Spacer/Aero-Holding Chambers (AEROCHAMBER PLUS) inhaler Use as instructed  ? ?No facility-administered encounter medications on file as of 04/13/2022.  ? ? ? ?REVIEW OF SYSTEMS  : All other systems reviewed and negative except where noted in the History of Present Illness. ? ? ?PHYSICAL EXAM: ?BP 100/60   Pulse 96   Ht '5\' 6"'$  (1.676 m)   Wt 176 lb 12.8 oz (80.2 kg)   BMI 28.54 kg/m?  ?General: Well developed male in no acute distress ?Head: Normocephalic and atraumatic ?Eyes:  Sclerae anicteric, conjunctiva pink. ?Ears: Normal auditory acuity ?Lungs: Clear throughout to auscultation; no W/R/R. ?Heart: Regular rate and rhythm; no M/R/G. ?Abdomen: Soft, non-distended.  BS present.  Mild epigastric  TTP. ?Musculoskeletal: Symmetrical with no gross deformities  ?Skin: No lesions on visible extremities ?Extremities: No edema  ?Neurological: Alert oriented x 4, grossly non-focal ?Psychological:  Alert and cooperative. Normal mood and affect ? ?ASSESSMENT AND PLAN: ?*Epigastric abdominal pain: Says that this has been present over the past 3 years or so, but is worsening.  He has been on pantoprazole daily for about the past year or so.  He has had ultrasound remotely, but had a CT scan and MRI of his abdomen in late 2022.  Try to reduce NSAID use.  CT scan in November 2022 showed mild  symmetrical wall thickening of the gastric antrum possibly reflecting gastritis. ?*Anemia: Unfortunately we not have any recent labs.  We are requesting those from his PCP.  Four years ago his hemoglobin was 10.1 g with a normal MCV.  He says that he always has a lot of blood work done at his PCPs office.  Reports having had a colonoscopy last year [facility].  We will request those records. ?*Alcohol use/abuse ? ? ?CC:  Vincent Pique, MD ? ?  ?

## 2022-04-13 NOTE — Progress Notes (Signed)
Reviewed and agree with management plans. ? ?Selia Wareing L. Renaye Janicki, MD, MPH  ?

## 2022-04-14 ENCOUNTER — Ambulatory Visit (AMBULATORY_SURGERY_CENTER): Payer: 59 | Admitting: Gastroenterology

## 2022-04-14 ENCOUNTER — Encounter: Payer: Self-pay | Admitting: Gastroenterology

## 2022-04-14 VITALS — BP 109/73 | HR 56 | Temp 98.0°F | Resp 20 | Ht 66.0 in | Wt 176.0 lb

## 2022-04-14 DIAGNOSIS — G8929 Other chronic pain: Secondary | ICD-10-CM

## 2022-04-14 DIAGNOSIS — K2289 Other specified disease of esophagus: Secondary | ICD-10-CM | POA: Diagnosis not present

## 2022-04-14 DIAGNOSIS — R933 Abnormal findings on diagnostic imaging of other parts of digestive tract: Secondary | ICD-10-CM

## 2022-04-14 DIAGNOSIS — K219 Gastro-esophageal reflux disease without esophagitis: Secondary | ICD-10-CM

## 2022-04-14 DIAGNOSIS — K2970 Gastritis, unspecified, without bleeding: Secondary | ICD-10-CM | POA: Diagnosis not present

## 2022-04-14 DIAGNOSIS — K2950 Unspecified chronic gastritis without bleeding: Secondary | ICD-10-CM | POA: Diagnosis not present

## 2022-04-14 DIAGNOSIS — R1013 Epigastric pain: Secondary | ICD-10-CM

## 2022-04-14 MED ORDER — SODIUM CHLORIDE 0.9 % IV SOLN: 500.0000 mL | Freq: Once | INTRAVENOUS | Status: DC

## 2022-04-14 NOTE — Progress Notes (Signed)
Pt is a smoker.  Uses an inhaler everyday (did this am).  Actively coughing pre procedure.  Therefore lido and robinul used to try to decrease coughing during procedure ? ?

## 2022-04-14 NOTE — Progress Notes (Signed)
Report to PACU, RN, vss, BBS= Clear.  

## 2022-04-14 NOTE — Progress Notes (Signed)
Indications for upper endoscopy: Epigastric abdominal pain, abnormal CT scan 1122 showing mild symmetrical wall thickening of the gastric antrum ? ?Please see Vincent Marquez office note from 04/13/2022 for complete details.  There is been no change in history or physical exam since that time.  The patient remains an appropriate candidate for monitored anesthesia care in the endoscopic unit today. ?

## 2022-04-14 NOTE — Op Note (Signed)
[place] ?Patient Name: Vincent Marquez ?Procedure Date: 04/14/2022 10:32 AM ?MRN: [id number] ?Endoscopist: Thornton Park MD, MD ?Age: 71 ?Referring MD:  ?Date of Birth: March 21, 1951 ?Gender: Male ?Account #: [id number] ?Procedure:                Upper GI endoscopy ?Indications:              Epigastric abdominal pain, Abnormal CT of the GI  ?                          tract with CT showing a thickened antrum ?Medicines:                Monitored Anesthesia Care ?Procedure:                Pre-Anesthesia Assessment: ?                          - Prior to the procedure, a History and Physical  ?                          was performed, and patient medications and  ?                          allergies were reviewed. The patient's tolerance of  ?                          previous anesthesia was also reviewed. The risks  ?                          and benefits of the procedure and the sedation  ?                          options and risks were discussed with the patient.  ?                          All questions were answered, and informed consent  ?                          was obtained. Prior Anticoagulants: The patient has  ?                          taken no previous anticoagulant or antiplatelet  ?                          agents. ASA Grade Assessment: II - A patient with  ?                          mild systemic disease. After reviewing the risks  ?                          and benefits, the patient was deemed in  ?                          satisfactory condition to undergo the procedure. ?  After obtaining informed consent, the endoscope was  ?                          passed under direct vision. Throughout the  ?                          procedure, the patient's blood pressure, pulse, and  ?                          oxygen saturations were monitored continuously. The  ?                          GIF [id number] #[id number] was introduced through the  ?                          mouth, and advanced  to the third part of duodenum.  ?                          The upper GI endoscopy was accomplished without  ?                          difficulty. The patient tolerated the procedure  ?                          well. ?Scope In: ?Scope Out: ?Findings:                 Patchy, white plaques were found in the middle  ?                          third of the esophagus. Most of these plaques moved  ?                          with lavage. The z-line is located 40 cm from the  ?                          incisors. Biopsies were taken with a cold forceps  ?                          for histology. Estimated blood loss was minimal. ?                          Patchy minimal inflammation characterized by  ?                          erythema, friability and granularity was found in  ?                          the gastric body. Biopsies were taken from the  ?                          antrum, body, and fundus with a cold forceps for  ?  histology. Estimated blood loss was minimal. ?                          A single small nodule was found in the second  ?                          portion of the duodenum. The nodule was removed  ?                          with cold forceps for histology. Estimated blood  ?                          loss was minimal. ?Complications:            No immediate complications. ?Estimated Blood Loss:     Estimated blood loss was minimal. ?Impression:               - Esophageal plaques were found, doubt candidiasis.  ?                          Biopsied. ?                          - Gastritis. Biopsied. ?                          - Nodule found in the duodenum. Biopsied. ?Recommendation:           - Patient has a contact number available for  ?                          emergencies. The signs and symptoms of potential  ?                          delayed complications were discussed with the  ?                          patient. Return to normal activities tomorrow.  ?                           Written discharge instructions were provided to the  ?                          patient. ?                          - Resume previous diet. ?                          - Continue present medications. ?                          - Await pathology results. ?                          - No aspirin, ibuprofen, naproxen, or other  ?  non-steroidal anti-inflammatory drugs. ?Thornton Park MD, MD ?04/14/2022 10:56:26 AM ?This report has been signed electronically. ?

## 2022-04-14 NOTE — Progress Notes (Signed)
Last drank 0800 1 cup of water pt reported. Maw ? ?

## 2022-04-14 NOTE — Patient Instructions (Signed)
Await pathology results. ? ?No aspirin, ibuprofen, naproxen, or there non-steroidal anti-inflammatory drugs. ? ?YOU HAD AN ENDOSCOPIC PROCEDURE TODAY AT [facility]:   Refer to the procedure report that was given to you for any specific questions about what was found during the examination.  If the procedure report does not answer your questions, please call your gastroenterologist to clarify.  If you requested that your care partner not be given the details of your procedure findings, then the procedure report has been included in a sealed envelope for you to review at your convenience later. ? ?YOU SHOULD EXPECT: Some feelings of bloating in the abdomen. Passage of more gas than usual.  Walking can help get rid of the air that was put into your GI tract during the procedure and reduce the bloating. If you had a lower endoscopy (such as a colonoscopy or flexible sigmoidoscopy) you may notice spotting of blood in your stool or on the toilet paper. If you underwent a bowel prep for your procedure, you may not have a normal bowel movement for a few days. ? ?Please Note:  You might notice some irritation and congestion in your nose or some drainage.  This is from the oxygen used during your procedure.  There is no need for concern and it should clear up in a day or so. ? ?SYMPTOMS TO REPORT IMMEDIATELY: ? ?Following upper endoscopy (EGD) ? Vomiting of blood or coffee ground material ? New chest pain or pain under the shoulder blades ? Painful or persistently difficult swallowing ? New shortness of breath ? Fever of 100?F or higher ? Black, tarry-looking stools ? ?For urgent or emergent issues, a gastroenterologist can be reached at any hour by calling [phone]. ?Do not use MyChart messaging for urgent concerns.  ? ? ?DIET:  We do recommend a small meal at first, but then you may proceed to your regular diet.  Drink plenty of fluids but you should avoid alcoholic beverages for 24  hours. ? ?ACTIVITY:  You should plan to take it easy for the rest of today and you should NOT DRIVE or use heavy machinery until tomorrow (because of the sedation medicines used during the test).   ? ?FOLLOW UP: ?Our staff will call the number listed on your records 48-72 hours following your procedure to check on you and address any questions or concerns that you may have regarding the information given to you following your procedure. If we do not reach you, we will leave a message.  We will attempt to reach you two times.  During this call, we will ask if you have developed any symptoms of COVID 19. If you develop any symptoms (ie: fever, flu-like symptoms, shortness of breath, cough etc.) before then, please call [phone].  If you test positive for Covid 19 in the 2 weeks post procedure, please call and report this information to [place].   ? ?If any biopsies were taken you will be contacted by phone or by letter within the next 1-3 weeks.  Please call us at [phone] if you have not heard about the biopsies in 3 weeks.  ? ? ?SIGNATURES/CONFIDENTIALITY: ?You and/or your care partner have signed paperwork which will be entered into your electronic medical record.  These signatures attest to the fact that that the information above on your After Visit Summary has been reviewed and is understood.  Full responsibility of the confidentiality of this discharge information lies with you and/or your care-partner.  ?

## 2022-04-14 NOTE — Progress Notes (Signed)
Called to room to assist during endoscopic procedure.  Patient ID and intended procedure confirmed with present staff. Received instructions for my participation in the procedure from the performing physician.  

## 2022-04-17 ENCOUNTER — Telehealth: Payer: Self-pay

## 2022-04-17 NOTE — Telephone Encounter (Signed)
Per Dr. Tarri Glenn request, pt has been scheduled for f/u on 05/02/22 @ 11am. Appt reminder has been mailed.  ?

## 2022-04-17 NOTE — Telephone Encounter (Signed)
-----   Message from Thornton Park, MD sent at 04/14/2022 10:56 AM EDT ----- ?Please arrange follow-up with me or Janett Billow whoever has the next available appointment.  Ideally this would be at least a week from now so that his pathology results are available.  Thank you. ? ?

## 2022-04-18 ENCOUNTER — Telehealth: Payer: Self-pay

## 2022-04-18 NOTE — Telephone Encounter (Signed)
Patient returned call and stated he is doing well  ?

## 2022-04-18 NOTE — Telephone Encounter (Signed)
12:24 pm phone call back to pt at [phone].  No answer.  The mail box was full and could not leave a message.  Will call pt back.  maw ?

## 2022-04-18 NOTE — Telephone Encounter (Signed)
Phone call to pt.  He said he took tylenol and his "stomach" medication.  He said he feels much better now.  Pain is down to 3 from 8 this am. I gave Vincent Marquez message from Dr. Tarri Glenn.  He said he did not want to come in for blood work or Ultrasound at this time. He did say it was the same pain he was having pre-procedure, but pain was worse this am.  He will call us back if pain worsens.  maw ?

## 2022-04-18 NOTE — Telephone Encounter (Signed)
?  Follow up Call- ? ? ?  04/14/2022  ?  9:51 AM  ?Call back number  ?Post procedure Call Back phone  # [phone] cell  ?Permission to leave phone message Yes  ?  ? ?Patient questions: ? ?Do you have a fever, pain , or abdominal swelling? Yes.   ?Pain Score  8 * ? ?Have you tolerated food without any problems? No. ? ?Have you been able to return to your normal activities? Yes.   ? ?Do you have any questions about your discharge instructions: ?Diet   No. ?Medications  No. ?Follow up visit  No. ? ?Do you have questions or concerns about your Care? Yes.   ? ?Actions: ?* If pain score is 4 or above: ?Physician/ provider Notified : Mickel Crow. Tarri Glenn, MD ? ?Pt c/o abdominal pain rating 8/10.  He reports pain is constant now.  I asked if he had taken any medication for pain.  Pt replied, "no".  I recommended to try tylenol.  I asked if he was eating food. Pt said recently he ate fried shrimp and french fries.  I also recommended he try to avoid fried, spicy and gas forming foods while he is having this abdominal pain.  Pt said he would try these suggestions.  Pt told we also need to get the pathology results back too.  This note was forwarded to Dr. Tarri Glenn. ? ? ?

## 2022-04-19 ENCOUNTER — Other Ambulatory Visit: Payer: Self-pay

## 2022-04-19 DIAGNOSIS — G8929 Other chronic pain: Secondary | ICD-10-CM

## 2022-04-19 MED ORDER — PANTOPRAZOLE SODIUM 40 MG PO TBEC: 40.0000 mg | DELAYED_RELEASE_TABLET | Freq: Two times a day (BID) | ORAL | 2 refills | Status: DC

## 2022-04-26 ENCOUNTER — Other Ambulatory Visit: Payer: Self-pay | Admitting: *Deleted

## 2022-04-26 DIAGNOSIS — G8929 Other chronic pain: Secondary | ICD-10-CM

## 2022-04-26 MED ORDER — PANTOPRAZOLE SODIUM 40 MG PO TBEC: 40.0000 mg | DELAYED_RELEASE_TABLET | Freq: Two times a day (BID) | ORAL | 2 refills | Status: DC

## 2022-04-27 ENCOUNTER — Telehealth: Payer: Self-pay | Admitting: Hematology and Oncology

## 2022-04-27 NOTE — Telephone Encounter (Signed)
Scheduled appt per 5/17 referral. Pt is aware of appt date and time. Pt is aware to arrive 15 mins prior to appt time and to bring and updated insurance card. Pt is aware of appt location.

## 2022-05-02 ENCOUNTER — Ambulatory Visit: Payer: 59 | Admitting: Gastroenterology

## 2022-05-15 ENCOUNTER — Inpatient Hospital Stay: Payer: 59 | Attending: Hematology and Oncology | Admitting: Hematology and Oncology

## 2022-05-19 ENCOUNTER — Telehealth: Payer: Self-pay | Admitting: Physician Assistant

## 2022-05-19 NOTE — Telephone Encounter (Signed)
R/s pt's new hem appt. Pt is aware of appt date and time. Mailed updated calendar to pt per request.

## 2022-06-06 ENCOUNTER — Telehealth: Payer: Self-pay | Admitting: Hematology and Oncology

## 2022-06-07 ENCOUNTER — Other Ambulatory Visit: Payer: 59

## 2022-06-07 ENCOUNTER — Encounter: Payer: 59 | Admitting: Physician Assistant

## 2022-06-14 ENCOUNTER — Telehealth: Payer: Self-pay | Admitting: Hematology

## 2022-06-14 NOTE — Telephone Encounter (Signed)
R/s pt's new hem appt. Pt is aware of new appt date and time.  °

## 2022-06-20 ENCOUNTER — Encounter: Payer: 59 | Admitting: Hematology and Oncology

## 2022-06-30 ENCOUNTER — Other Ambulatory Visit: Payer: Self-pay

## 2022-06-30 ENCOUNTER — Inpatient Hospital Stay: Payer: 59

## 2022-06-30 ENCOUNTER — Inpatient Hospital Stay: Payer: 59 | Attending: Hematology and Oncology | Admitting: Hematology

## 2022-06-30 ENCOUNTER — Encounter: Payer: Self-pay | Admitting: Hematology

## 2022-06-30 DIAGNOSIS — K219 Gastro-esophageal reflux disease without esophagitis: Secondary | ICD-10-CM | POA: Diagnosis not present

## 2022-06-30 DIAGNOSIS — Z800 Family history of malignant neoplasm of digestive organs: Secondary | ICD-10-CM | POA: Diagnosis not present

## 2022-06-30 DIAGNOSIS — F1090 Alcohol use, unspecified, uncomplicated: Secondary | ICD-10-CM

## 2022-06-30 DIAGNOSIS — B1920 Unspecified viral hepatitis C without hepatic coma: Secondary | ICD-10-CM | POA: Diagnosis not present

## 2022-06-30 DIAGNOSIS — J449 Chronic obstructive pulmonary disease, unspecified: Secondary | ICD-10-CM | POA: Diagnosis not present

## 2022-06-30 DIAGNOSIS — F17210 Nicotine dependence, cigarettes, uncomplicated: Secondary | ICD-10-CM | POA: Diagnosis not present

## 2022-06-30 DIAGNOSIS — D649 Anemia, unspecified: Secondary | ICD-10-CM

## 2022-06-30 DIAGNOSIS — Z809 Family history of malignant neoplasm, unspecified: Secondary | ICD-10-CM | POA: Diagnosis not present

## 2022-06-30 LAB — CBC WITH DIFFERENTIAL (CANCER CENTER ONLY)
Abs Immature Granulocytes: 0.01 10*3/uL (ref 0.00–0.07)
Basophils Absolute: 0 10*3/uL (ref 0.0–0.1)
Basophils Relative: 0 %
Eosinophils Absolute: 0 10*3/uL (ref 0.0–0.5)
Eosinophils Relative: 1 %
HCT: 28.4 % — ABNORMAL LOW (ref 39.0–52.0)
Hemoglobin: 9.3 g/dL — ABNORMAL LOW (ref 13.0–17.0)
Immature Granulocytes: 0 %
Lymphocytes Relative: 47 %
Lymphs Abs: 2.2 10*3/uL (ref 0.7–4.0)
MCH: 28.2 pg (ref 26.0–34.0)
MCHC: 32.7 g/dL (ref 30.0–36.0)
MCV: 86.1 fL (ref 80.0–100.0)
Monocytes Absolute: 1 10*3/uL (ref 0.1–1.0)
Monocytes Relative: 20 %
Neutro Abs: 1.5 10*3/uL — ABNORMAL LOW (ref 1.7–7.7)
Neutrophils Relative %: 32 %
Platelet Count: 383 10*3/uL (ref 150–400)
RBC: 3.3 MIL/uL — ABNORMAL LOW (ref 4.22–5.81)
RDW: 35.8 % — ABNORMAL HIGH (ref 11.5–15.5)
WBC Count: 4.7 10*3/uL (ref 4.0–10.5)
nRBC: 2.1 % — ABNORMAL HIGH (ref 0.0–0.2)

## 2022-06-30 LAB — RETIC PANEL
RBC.: 3.23 MIL/uL — ABNORMAL LOW (ref 4.22–5.81)
Retic Ct Pct: <0.4 % — ABNORMAL LOW (ref 0.4–3.1)
Reticulocyte Hemoglobin: 26.1 pg — ABNORMAL LOW (ref >27.9)

## 2022-06-30 LAB — IRON AND IRON BINDING CAPACITY (CC-WL,HP ONLY)
Iron: 263 ug/dL — ABNORMAL HIGH (ref 45–182)
Saturation Ratios: 101 % — ABNORMAL HIGH (ref 17.9–39.5)
TIBC: 262 ug/dL (ref 250–450)
UIBC: UNDETERMINED ug/dL (ref 117–376)

## 2022-06-30 LAB — FERRITIN: Ferritin: 746 ng/mL — ABNORMAL HIGH (ref 24–336)

## 2022-06-30 LAB — VITAMIN B12: Vitamin B-12: 422 pg/mL (ref 180–914)

## 2022-06-30 NOTE — Progress Notes (Signed)
[place]   [phone] [phone]   Clinic New Consult Note   Patient Care Team: Roselee Nova, MD as PCP - General (Family Medicine)  Date of Service:  06/30/2022   CHIEF COMPLAINTS/PURPOSE OF CONSULTATION:  Hemosiderosis  REFERRING PHYSICIAN:  Dr. Keith Rake  ASSESSMENT & PLAN:  Vincent Marquez is a 71 y.o. male with a history of COPD, tobacco use, alcohol use  1. 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 -he took oral iron for 1 year in 2019 for anemia.  Has never received IV iron or blood transfusion that he is aware of. -most recent ferritin from 09/19/21 was 970, iron 258. --I discussed the findings thus far with him today. I explained that there is concern for hereditary hemochromatosis.  I will repeat the lab CBC, iron study including ferritin, serum iron and TIBC, and hemochromatosis genetic testing. -I briefly discussed treatment for iron overload.  Phlebotomy is usually the most common method, however he is anemic, which will limit his ability to have phlebotomy.  I briefly discussed oral medicine for iron chelating.  2. Anemia -dating back to at least 2019, took oral iron PRN (based on symptoms) for about a year. -most recent hgb with PCP on 04/07/22 was 9.7. -will obtain repeat lab today, and check routine count, B12, folate.   3. Hepatitis C, untreated  -positive antibody on 09/19/21 and 03/20/22 lab work, RNA not detectable on either. -He was unaware of diagnosis until reently, denies any prior treatment  4. Epigastric pain, GERD -ongoing since at least 2019, per chart review -prescribed pantoprazole by PCP with no relief -endoscopy on 04/14/22 by Dr. Tarri Glenn showed chronic gastritis and reflux esophagitis. -physical exam shows tenderness to stomach area -continue f/u with GI   PLAN:  -lab  today -phone visit in 2 weeks    HISTORY OF PRESENTING ILLNESS:  Vincent Marquez 71 y.o. male is a here because of iron overload. The patient was referred by Dr. Manuella Ghazi. The patient presents to the clinic today alone.  He has been having intermittent epigastric pain since last year, no nausea, change of bowel habit, or rectal bleeding.  He was seen by PCP, and underwent CT abdomen pelvis in November 2022, which showed mild symmetric wall thickening of the gastric antrum, possible gastritis, and colonic diverticulosis.  He subsequently had abdominal MRI with and without contrast on November 15, 2021, which showed similar findings, and iron deposits in the liver, spleen and bone marrow, consistent with hemosiderosis.  He was referred to GI Dr. Tarri Glenn in May 2023.  He underwent EGD on Apr 14, 2022, which showed gastritis, and esophageal plaque.  No malignancy or positive findings.  He was found to have moderate to moderate anemia in 2018, and he took oral iron for 1 year.  He has never received a blood transfusion or IV iron.  No family history of liver cirrhosis or liver disease.  He does complain low libido, and wanted to check his testosterone, but it has not been done.  He has a PMHx of.... -COPD, on Trelegy and albuterol -GERD, on pantoprazole   Socially... -he is single with a significant other. He has 8 children. -he reports stomach(?) cancer in two sisters, and possibly cancer in his mother (unsure). -drinks alcohol on weekends, 1-2 beers. He denies h/o excessive use -current smoker, since age 84, about 1  ppd.   REVIEW OF SYSTEMS:    Constitutional: Denies fevers, chills or abnormal night sweats Eyes: Denies blurriness of vision, double vision or watery eyes Ears, nose, mouth, throat, and face: Denies mucositis or sore throat Respiratory: Denies cough, dyspnea or wheezes Cardiovascular: Denies palpitation, chest discomfort or lower extremity swelling Gastrointestinal:  Denies nausea,  heartburn or change in bowel habits Skin: Denies abnormal skin rashes Lymphatics: Denies new lymphadenopathy or easy bruising Neurological:Denies numbness, tingling or new weaknesses Behavioral/Psych: Mood is stable, no new changes  All other systems were reviewed with the patient and are negative.   MEDICAL HISTORY:  Past Medical History:  Diagnosis Date   Anemia    COPD (chronic obstructive pulmonary disease) (HCC)    GERD (gastroesophageal reflux disease)    Hypertension     SURGICAL HISTORY: Past Surgical History:  Procedure Laterality Date   COLONOSCOPY     MULTIPLE TOOTH EXTRACTIONS      SOCIAL HISTORY: Social History   Socioeconomic History   Marital status: Single    Spouse name: Not on file   Number of children: 8   Years of education: Not on file   Highest education level: Not on file  Occupational History   Not on file  Tobacco Use   Smoking status: Every Day    Packs/day: 1.00    Years: 50.00    Total pack years: 50.00    Types: Cigarettes   Smokeless tobacco: Never  Vaping Use   Vaping Use: Never used  Substance and Sexual Activity   Alcohol use: Yes    Comment: weekend drinker for a few beers   Drug use: No   Sexual activity: Not on file  Other Topics Concern   Not on file  Social History Narrative   Nature conservation officer    Social Determinants of Health   Financial Resource Strain: Not on file  Food Insecurity: Not on file  Transportation Needs: Not on file  Physical Activity: Not on file  Stress: Not on file  Social Connections: Not on file  Intimate Partner Violence: Not on file    FAMILY HISTORY: Family History  Problem Relation Age of Onset   Cancer Mother    Cancer Sister        gastric cancer   Other Sister        Unknown stomach issues   Cancer Sister        gastric cancer   Other Sister        Stomach issues, possibly cancer   Colon cancer Neg Hx    Esophageal cancer Neg Hx    Rectal cancer Neg Hx    Stomach cancer  Neg Hx     ALLERGIES:  has No Known Allergies.  MEDICATIONS:  Current Outpatient Medications  Medication Sig Dispense Refill   albuterol (PROVENTIL HFA;VENTOLIN HFA) 108 (90 Base) MCG/ACT inhaler Inhale 1-2 puffs into the lungs every 6 (six) hours as needed for wheezing or shortness of breath. 1 Inhaler 0   Fluticasone-Umeclidin-Vilant 100-62.5-25 MCG/INH AEPB Inhale into the lungs.     pantoprazole (PROTONIX) 40 MG tablet Take 1 tablet (40 mg total) by mouth 2 (two) times daily. 180 tablet 2   No current facility-administered medications for this visit.    PHYSICAL EXAMINATION: ECOG PERFORMANCE STATUS: 1 - Symptomatic but completely ambulatory  Vitals:   06/30/22 1135  BP: (!) 144/82  Pulse: 85  Resp: 19  Temp: 98.4 F (36.9 C)  SpO2: 99%   Filed Weights  06/30/22 1135  Weight: 176 lb 6.4 oz (80 kg)    GENERAL:alert, no distress and comfortable SKIN: skin color, texture, turgor are normal, no rashes or significant lesions EYES: normal, Conjunctiva are pink and non-injected, sclera clear  NECK: supple, thyroid normal size, non-tender, without nodularity LYMPH:  no palpable lymphadenopathy in the cervical, axillary  LUNGS: clear to auscultation and percussion with normal breathing effort HEART: regular rate & rhythm and no murmurs and no lower extremity edema ABDOMEN:abdomen soft, and normal bowel sounds, (+) tenderness to epigastric area Musculoskeletal:no cyanosis of digits and no clubbing  NEURO: alert & oriented x 3 with fluent speech, no focal motor/sensory deficits  LABORATORY DATA:  I have reviewed the data as listed    Latest Ref Rng & Units 06/30/2022   12:27 PM 01/09/2018   11:30 AM 01/29/2009    5:00 PM  CBC  WBC 4.0 - 10.5 K/uL 4.7  6.9  6.9   Hemoglobin 13.0 - 17.0 g/dL 9.3  10.1  13.5   Hematocrit 39.0 - 52.0 % 28.4  31.5  40.5   Platelets 150 - 400 K/uL 383  289  187        Latest Ref Rng & Units 01/09/2018   11:30 AM 01/29/2009    5:00 PM  CMP   Glucose 65 - 99 mg/dL 99  104   BUN 6 - 20 mg/dL 5  7   Creatinine 0.61 - 1.24 mg/dL 0.80  1.06   Sodium 135 - 145 mmol/L 138  140   Potassium 3.5 - 5.1 mmol/L 4.2  4.3   Chloride 101 - 111 mmol/L 105  102   CO2 22 - 32 mmol/L 25  30   Calcium 8.9 - 10.3 mg/dL 8.9  8.9   Total Protein 6.5 - 8.1 g/dL 6.9  6.9   Total Bilirubin 0.3 - 1.2 mg/dL 1.3  0.5   Alkaline Phos 38 - 126 U/L 73  84   AST 15 - 41 U/L 22  31   ALT 17 - 63 U/L 17  28      RADIOGRAPHIC STUDIES: I have personally reviewed the radiological images as listed and agreed with the findings in the report. No results found.   Orders Placed This Encounter  Procedures   CBC with Differential ([place] Only)    Standing Status:   Future    Number of Occurrences:   1    Standing Expiration Date:   07/01/2023   Ferritin    Standing Status:   Future    Number of Occurrences:   1    Standing Expiration Date:   07/01/2023   Retic Panel    Standing Status:   Future    Number of Occurrences:   1    Standing Expiration Date:   07/01/2023   Vitamin B12    Standing Status:   Future    Number of Occurrences:   1    Standing Expiration Date:   06/30/2023   Methylmalonic acid, serum    Standing Status:   Future    Number of Occurrences:   1    Standing Expiration Date:   06/30/2023   Folate RBC    Standing Status:   Future    Number of Occurrences:   1    Standing Expiration Date:   06/30/2023   Erythropoietin    Standing Status:   Future    Number of Occurrences:   1    Standing Expiration Date:  06/30/2023   Testosterone, free, total    Standing Status:   Future    Number of Occurrences:   1    Standing Expiration Date:   06/30/2023   Hemochromatosis DNA, PCR    Standing Status:   Future    Number of Occurrences:   1    Standing Expiration Date:   06/30/2023    All questions were answered. The patient knows to call the clinic with any problems, questions or concerns. The total time spent in the appointment was  30 minutes.     Truitt Merle, MD 06/30/2022 5:28 PM  I, Wilburn Mylar, am acting as scribe for Truitt Merle, MD.   I have reviewed the above documentation for accuracy and completeness, and I agree with the above.

## 2022-07-03 ENCOUNTER — Other Ambulatory Visit: Payer: Self-pay

## 2022-07-03 LAB — FOLATE RBC
Folate, Hemolysate: 422 ng/mL
Folate, RBC: 1435 ng/mL (ref >498)
Hematocrit: 29.4 % — ABNORMAL LOW (ref 37.5–51.0)

## 2022-07-03 LAB — METHYLMALONIC ACID, SERUM: Methylmalonic Acid, Quantitative: 204 nmol/L (ref 0–378)

## 2022-07-03 NOTE — Progress Notes (Signed)
Epic faxed Dr. Ernestina Penna last office note to pt's PCP Dr. Manuella Ghazi for her to review.

## 2022-07-04 LAB — ERYTHROPOIETIN: Erythropoietin: 141.7 m[IU]/mL — ABNORMAL HIGH (ref 2.6–18.5)

## 2022-07-06 LAB — HEMOCHROMATOSIS DNA-PCR(C282Y,H63D)

## 2022-07-07 LAB — TESTOSTERONE, FREE, TOTAL, SHBG
Sex Hormone Binding: 49.1 nmol/L (ref 19.3–76.4)
Testosterone, Free: 2 pg/mL — ABNORMAL LOW (ref 6.6–18.1)
Testosterone: 239 ng/dL — ABNORMAL LOW (ref 264–916)

## 2022-07-12 ENCOUNTER — Telehealth: Payer: Self-pay

## 2022-07-12 ENCOUNTER — Other Ambulatory Visit: Payer: Self-pay

## 2022-07-12 NOTE — Telephone Encounter (Signed)
Pt called regarding f/u appt with Dr. Burr Medico to go over lab results.  Returned pt's call but was unable to reach pt and pt's voicemail is not setup.  Pt has a telephone f/u visit scheduled on 07/14/2022.  Will try calling pt on 07/13/2022.

## 2022-07-14 ENCOUNTER — Telehealth: Payer: Self-pay

## 2022-07-14 ENCOUNTER — Other Ambulatory Visit: Payer: Self-pay

## 2022-07-14 ENCOUNTER — Encounter: Payer: Self-pay | Admitting: Hematology

## 2022-07-14 ENCOUNTER — Inpatient Hospital Stay: Payer: 59 | Attending: Hematology and Oncology | Admitting: Hematology

## 2022-07-14 DIAGNOSIS — E291 Testicular hypofunction: Secondary | ICD-10-CM | POA: Diagnosis not present

## 2022-07-14 DIAGNOSIS — D649 Anemia, unspecified: Secondary | ICD-10-CM

## 2022-07-14 NOTE — Telephone Encounter (Signed)
Epic faxed Dr. Ernestina Penna office note for today to pt's PCP Dr. Keith Rake.  Epic fax confirmation received.

## 2022-07-14 NOTE — Progress Notes (Signed)
[place]   [phone]) [phone] [phone]) [phone]   Clinic Follow up Note   Patient Care Team: Roselee Nova, MD as PCP - General (Family Medicine)  Date of Service:  07/14/2022  I connected with [name] on 07/14/2022 at 10:20 AM EDT by telephone visit and verified that I am speaking with the correct person using two identifiers.  I discussed the limitations, risks, security and privacy concerns of performing an evaluation and management service by telephone and the availability of in person appointments. I also discussed with the patient that there may be a patient responsible charge related to this service. The patient expressed understanding and agreed to proceed.   Other persons participating in the visit and their role in the encounter:  none  Patient's location:  home Provider's location:  my office  CHIEF COMPLAINT: f/u of hemosiderosis  CURRENT THERAPY:  Pending   ASSESSMENT & PLAN:  Vincent Marquez is a 71 y.o. male with   1. Hemosiderosis, HFE mutation (-) -presented with intermittent abdominal pain, abdomen MRI 11/15/21 showed iron deposition in liver, spleen and bone marrow. -he took oral iron for 1 year in 2019 for anemia.  Has never received IV iron or blood transfusion that he is aware of. -hemochromatosis genetic testing on 06/30/22 negative. I reviewed with him  -other labs from 7/21 showed-- ferritin 746, iron 263 with saturation 101%, Hg 9.3, WBC and platelet count normal, folate and B12, methylmalonic acid levels were normal, erythropoietin level elevated at 141.75mIU/ml.  His testosterone level was low, which likely contributed to his anemia. --I discussed the findings thus far with him today.  I recommend a bone marrow biopsy for his anemia, rule out a primary bone marrow disease, and confirm excessive iron deposit in bone marrow.  -He is not a candidate for phlebotomy due to his anemia.  I will likely start him on iron chelating after  next visit.   2.  Normocytic, hypoproductive anemia -anemia dating back to at least 2019, took oral iron PRN (based on symptoms) for about a year. -most recent hgb on 06/30/22 was down to 9.3. Vit B12 was WNL, ferritin and iron were elevated.  No lab evidence of nutritional anemia.  Reticulocyte count is very low (<0.4%), no evidence of hemolysis. -He does have low testosterone level, which contributes to his anemia. -I recommend bone marrow biopsy to rule out other causes for his anemia.   3. Hepatitis C, untreated  -positive antibody on 09/19/21 and 03/20/22 lab work, RNA not detectable on either. -He was unaware of diagnosis until recently, denies any prior treatment   4. Epigastric pain, GERD -ongoing since at least 2019, per chart review -prescribed pantoprazole by PCP with no relief -endoscopy on 04/14/22 by Dr. Tarri Glenn showed chronic gastritis and reflux esophagitis. -physical exam shows tenderness to stomach area -continue f/u with GI   5.  Hypotestosteronemia -Likely contributes to his anemia, he has noted below. -We will defer to his primary care physician Dr. Manuella Ghazi for testosterone replacement.   PLAN:  -Lab results reviewed with patient -I recommend a bone marrow biopsy in the next 2 to 3 weeks, will schedule for him. -Lab and follow-up 1 week after his bone marrow biopsy.   No problem-specific Assessment & Plan notes found for this encounter.   INTERVAL HISTORY:  [name] was contacted for a follow up of hemosiderosis. He was last seen by me on 06/30/22 in consultation.  He reports he remains  fatigued, otherwise doing well.   All other systems were reviewed with the patient and are negative.  MEDICAL HISTORY:  Past Medical History:  Diagnosis Date   Anemia    COPD (chronic obstructive pulmonary disease) (HCC)    GERD (gastroesophageal reflux disease)    Hypertension     SURGICAL HISTORY: Past Surgical History:  Procedure Laterality Date   COLONOSCOPY      MULTIPLE TOOTH EXTRACTIONS      I have reviewed the social history and family history with the patient and they are unchanged from previous note.  ALLERGIES:  has No Known Allergies.  MEDICATIONS:  Current Outpatient Medications  Medication Sig Dispense Refill   albuterol (PROVENTIL HFA;VENTOLIN HFA) 108 (90 Base) MCG/ACT inhaler Inhale 1-2 puffs into the lungs every 6 (six) hours as needed for wheezing or shortness of breath. 1 Inhaler 0   Fluticasone-Umeclidin-Vilant 100-62.5-25 MCG/INH AEPB Inhale into the lungs.     pantoprazole (PROTONIX) 40 MG tablet Take 1 tablet (40 mg total) by mouth 2 (two) times daily. 180 tablet 2   No current facility-administered medications for this visit.    PHYSICAL EXAMINATION: ECOG PERFORMANCE STATUS: 1 - Symptomatic but completely ambulatory  There were no vitals filed for this visit. Wt Readings from Last 3 Encounters:  06/30/22 176 lb 6.4 oz (80 kg)  04/14/22 176 lb (79.8 kg)  04/13/22 176 lb 12.8 oz (80.2 kg)     No vitals taken today, Exam not performed today  LABORATORY DATA:  I have reviewed the data as listed    Latest Ref Rng & Units 06/30/2022   12:27 PM 01/09/2018   11:30 AM 01/29/2009    5:00 PM  CBC  WBC 4.0 - 10.5 K/uL 4.7  6.9  6.9   Hemoglobin 13.0 - 17.0 g/dL 9.3  10.1  13.5   Hematocrit 37.5 - 51.0 % 39.0 - 52.0 % 29.4    28.4  31.5  40.5   Platelets 150 - 400 K/uL 383  289  187         Latest Ref Rng & Units 01/09/2018   11:30 AM 01/29/2009    5:00 PM  CMP  Glucose 65 - 99 mg/dL 99  104   BUN 6 - 20 mg/dL 5  7   Creatinine 0.61 - 1.24 mg/dL 0.80  1.06   Sodium 135 - 145 mmol/L 138  140   Potassium 3.5 - 5.1 mmol/L 4.2  4.3   Chloride 101 - 111 mmol/L 105  102   CO2 22 - 32 mmol/L 25  30   Calcium 8.9 - 10.3 mg/dL 8.9  8.9   Total Protein 6.5 - 8.1 g/dL 6.9  6.9   Total Bilirubin 0.3 - 1.2 mg/dL 1.3  0.5   Alkaline Phos 38 - 126 U/L 73  84   AST 15 - 41 U/L 22  31   ALT 17 - 63 U/L 17  28        RADIOGRAPHIC STUDIES: I have personally reviewed the radiological images as listed and agreed with the findings in the report. No results found.    No orders of the defined types were placed in this encounter.  All questions were answered. The patient knows to call the clinic with any problems, questions or concerns. No barriers to learning was detected. The total time spent in the appointment was 22 minutes.     Truitt Merle, MD 07/14/2022   I, Wilburn Mylar, am acting as scribe for [employer]  Burr Medico, MD.   I have reviewed the above documentation for accuracy and completeness, and I agree with the above.

## 2022-07-17 ENCOUNTER — Telehealth: Payer: Self-pay | Admitting: Hematology

## 2022-07-17 NOTE — Telephone Encounter (Signed)
Per 8/4 los called pt. Pt did not answer and there was no voicemail to leave a message

## 2022-07-28 ENCOUNTER — Other Ambulatory Visit: Payer: Self-pay | Admitting: Physician Assistant

## 2022-07-28 DIAGNOSIS — D649 Anemia, unspecified: Secondary | ICD-10-CM

## 2022-07-31 ENCOUNTER — Other Ambulatory Visit: Payer: Self-pay | Admitting: Student

## 2022-07-31 ENCOUNTER — Ambulatory Visit (HOSPITAL_COMMUNITY): Payer: 59

## 2022-07-31 ENCOUNTER — Ambulatory Visit (HOSPITAL_COMMUNITY)
Admission: RE | Admit: 2022-07-31 | Discharge: 2022-07-31 | Disposition: A | Discharge status: Home / Self Care | Payer: 59 | Source: Ambulatory Visit | Attending: Hematology | Admitting: Hematology

## 2022-08-02 IMAGING — MR MR ABDOMEN WO/W CM
13 of 17 series · 30 of 48 positions shown · IV contrast (15 mL Multihance)
Comparison: CT on 10/17/2021

CLINICAL DATA: Abdominal pain. Mild wall thickening of the gastric
antrum on recent CT.

EXAM:
MRI ABDOMEN WITHOUT AND WITH CONTRAST
TECHNIQUE: Multiplanar multisequence MR imaging of the abdomen was performed
both before and after the administration of intravenous contrast.
CONTRAST:  15mL MULTIHANCE GADOBENATE DIMEGLUMINE 529 MG/ML IV SOLN

[Series 3: T2 · coronal · 5.0mm · 1.56mm/px · 1 of 30 slices shown (1 of 3)]
[im 1/30]
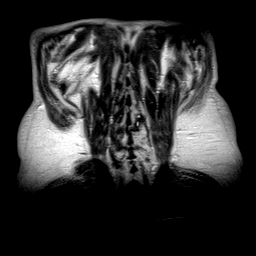

[Series 4: axial tru fisp · axial · 5.0mm · 1.41mm/px · 1 of 37 slices shown]
[im 1/37]
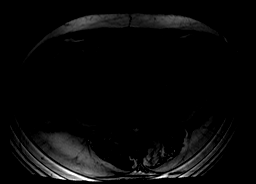

[Series 5: ep2d_diff_b50_500_800_p2 · axial · 6.0mm · 1.98mm/px · z∈[-112,+114]mm · 2 of 90 slices shown]
[im 1/90]
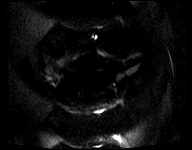
[im 90/90]
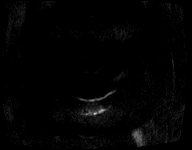

[Series 6: ep2d_[id number]_500_800_p2_adc · axial · 6.0mm · 1.98mm/px · 1 of 30 slices shown]
[im 1/30]
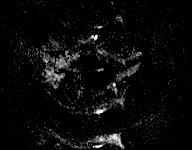

[Series 7: T2 · axial · 6.5mm · 0.74mm/px · 1 of 30 slices shown (2 of 3)]
[im 1/30]
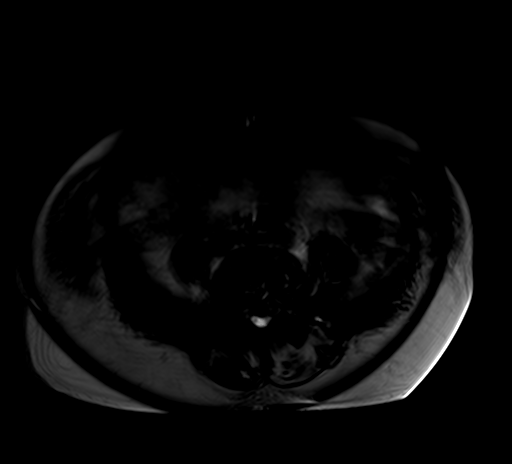

[Series 8: T2 · axial · 5.0mm · 1.37mm/px · 1 of 35 slices shown (3 of 3)]
[im 1/35]
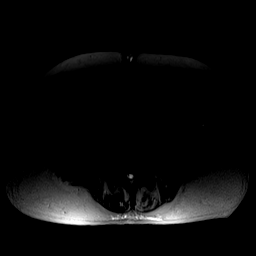

[Series 9: axial in out · axial · 6.0mm · 0.74mm/px · z∈[-112,+115]mm · 2 of 68 slices shown]
[im 1/68]
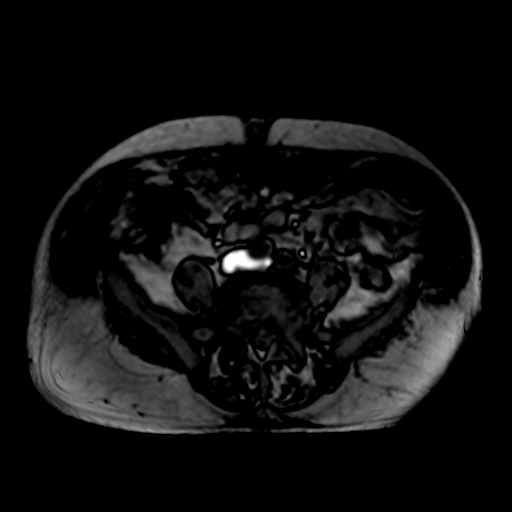
[im 68/68]
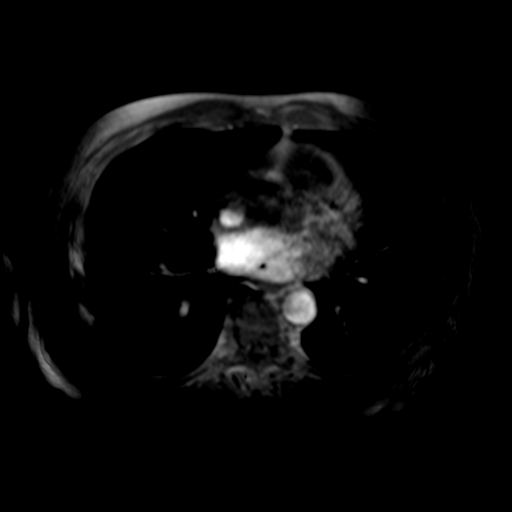

[Series 10: T1 dynamic · axial · non-contrast · 2.2mm · 0.78mm/px · z∈[-103,+106]mm · 4 of 96 slices shown]
[im 1/96]
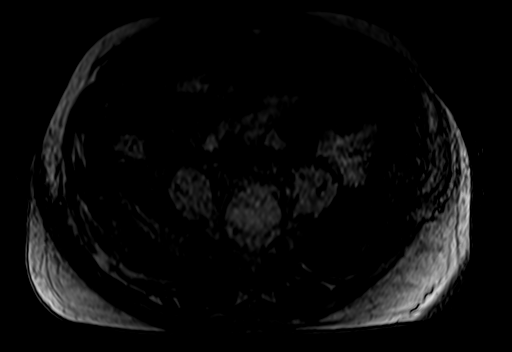
[im 32/96]
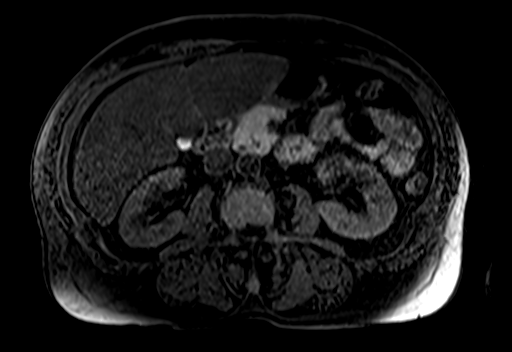
[im 64/96]
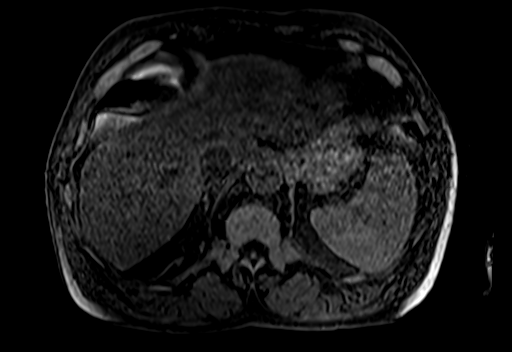
[im 96/96]
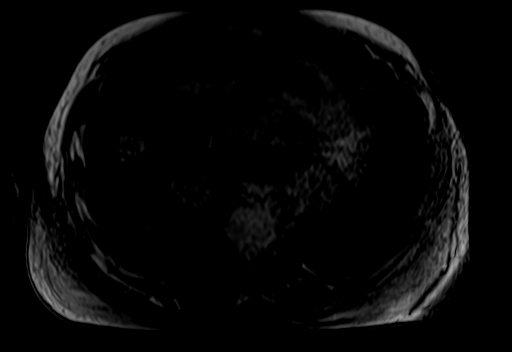

[Series 11: post 25 sec · axial · 2.2mm · 0.78mm/px · z∈[-103,+106]mm · 4 of 96 slices shown]
[im 1/96]
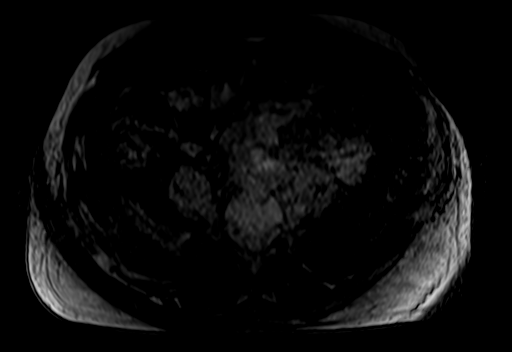
[im 32/96]
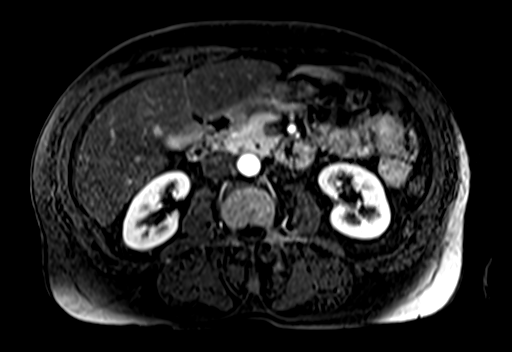
[im 64/96]
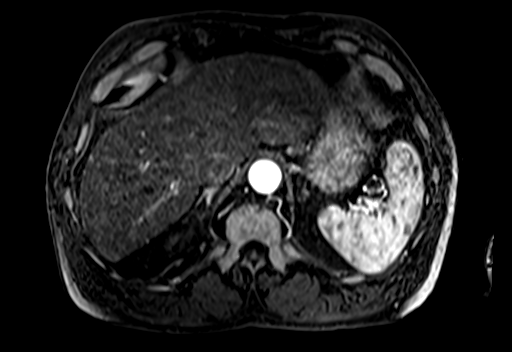
[im 96/96]
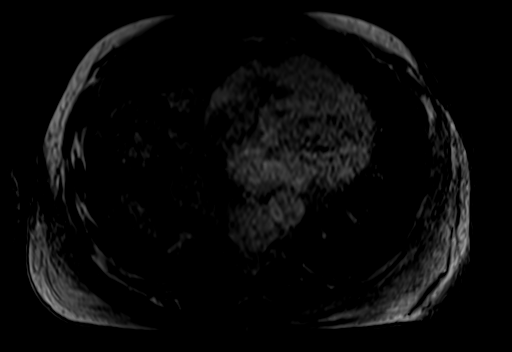

[Series 12: post 25 sec_sub · axial · 2.2mm · 0.78mm/px · z∈[-103,+106]mm · 4 of 96 slices shown]
[im 1/96]
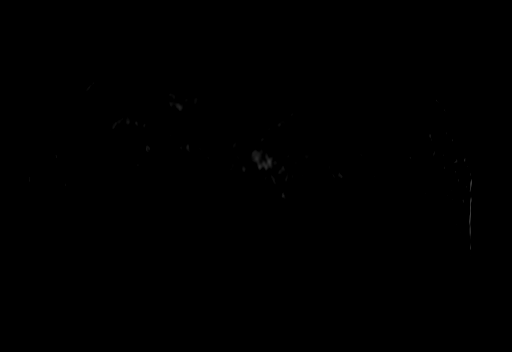
[im 32/96]
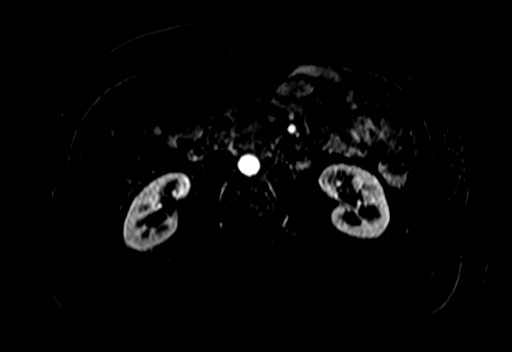
[im 64/96]
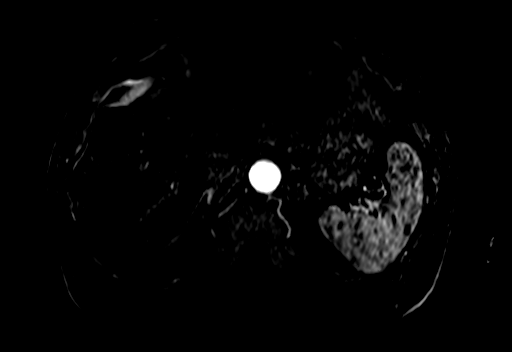
[im 96/96]
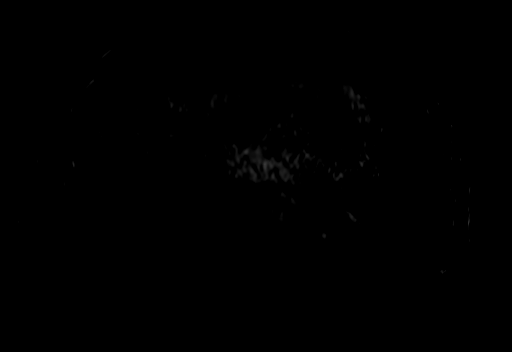

[Series 13: post 45 sec · axial · 2.2mm · 0.78mm/px · z∈[-103,+106]mm · 4 of 96 slices shown]
[im 1/96]
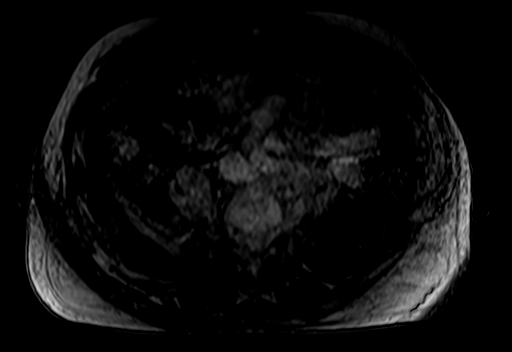
[im 32/96]
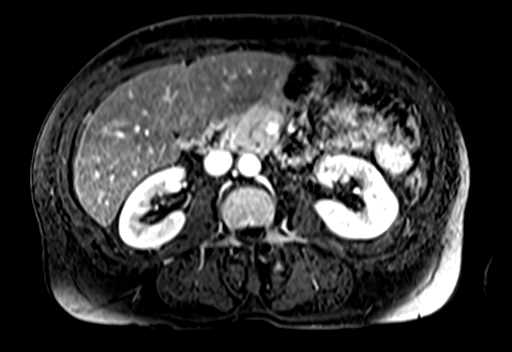
[im 64/96]
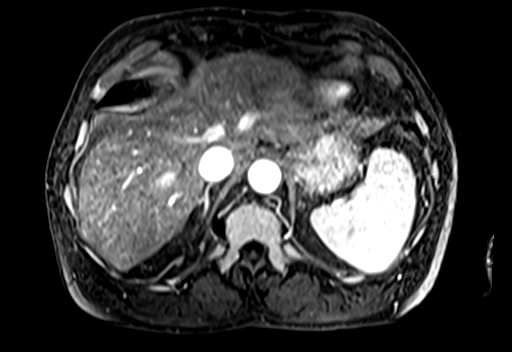
[im 96/96]
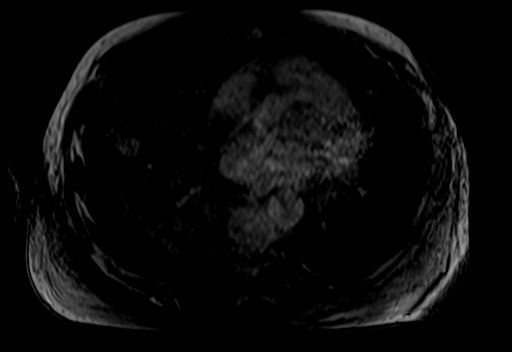

[Series 14: post 45 sec_sub · axial · 2.2mm · 0.78mm/px · z∈[-103,+106]mm · 4 of 95 slices shown]
[im 1/95]
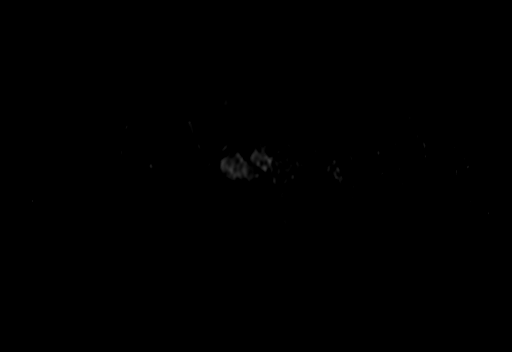
[im 32/95]
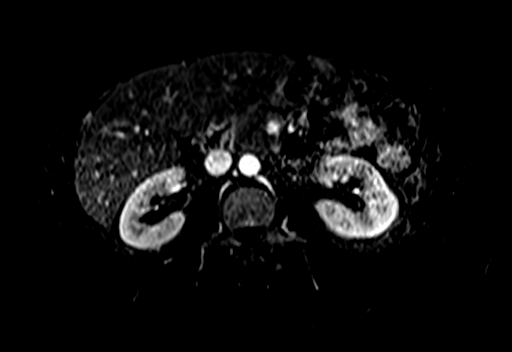
[im 63/95]
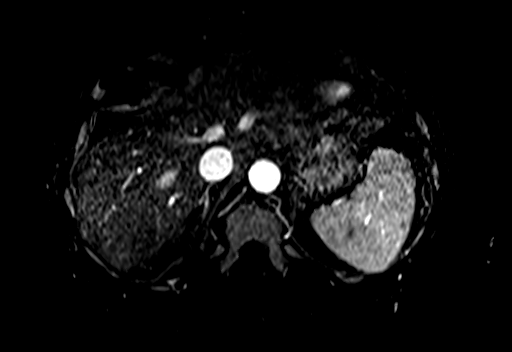
[im 95/95]
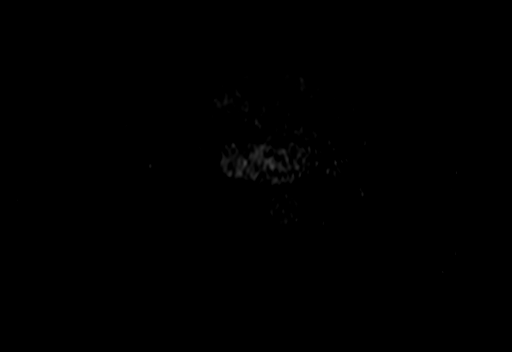

[Series 15: post 90 sec · axial · 2.2mm · 0.78mm/px · 1 of 96 slices shown]
[im 1/96]
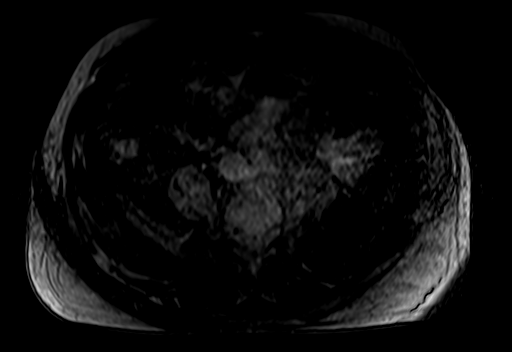

[30 of 48 positions shown; findings below may reference images not displayed]

FINDINGS: Lower chest: No acute findings.

Hepatobiliary: Severe diffuse T2 hypointensity of the hepatic
parenchyma is seen consistent with iron deposition. A tiny
sub-centimeter subcapsular cyst is seen in the posterior right
hepatic lobe. No hepatic masses identified. Layering gallbladder
sludge noted, however there is no evidence cholecystitis or biliary
ductal dilatation.

Pancreas:  No mass or inflammatory changes.

Spleen: Within normal limits in size. No masses identified. Mild T2
hypointensity, consistent with iron deposition.

Adrenals/Urinary Tract: No masses identified. No evidence of
hydronephrosis.

Stomach/Bowel: The stomach is nondistended, which limits evaluation.
No gastric mass or wall thickening is seen allowing for this
limitation.

Vascular/Lymphatic: No pathologically enlarged lymph nodes
identified. No acute vascular findings.

Other:  None.

Musculoskeletal: No suspicious bone lesions identified. Diffuse T2
hypointensity of the bone marrow is also seen, consistent with iron
deposition.
IMPRESSION: Limited evaluation of stomach due to nondistended state, however no
definite mass or abnormal wall thickening seen.

Layering gallbladder sludge, without evidence of cholecystitis or
biliary ductal dilatation.

Iron deposition in the liver, spleen, and bone marrow, consistent
with hemosiderosis.

## 2022-08-09 ENCOUNTER — Inpatient Hospital Stay: Payer: 59 | Admitting: Hematology

## 2022-08-17 ENCOUNTER — Other Ambulatory Visit: Payer: Self-pay | Admitting: Radiology

## 2022-08-18 ENCOUNTER — Ambulatory Visit (HOSPITAL_COMMUNITY): Payer: 59

## 2022-08-18 ENCOUNTER — Ambulatory Visit (HOSPITAL_COMMUNITY)
Admission: RE | Admit: 2022-08-18 | Discharge: 2022-08-18 | Disposition: A | Discharge status: Home / Self Care | Payer: 59 | Source: Ambulatory Visit | Attending: Hematology | Admitting: Hematology

## 2022-08-22 ENCOUNTER — Telehealth: Payer: Self-pay

## 2022-08-22 NOTE — Telephone Encounter (Signed)
This nurse attempted to reach patient related to scheduling bone marrow biopsy.  This nurse did not reach patient and was unable to leave a message.

## 2022-08-25 ENCOUNTER — Ambulatory Visit: Payer: 59 | Admitting: Hematology

## 2022-08-30 ENCOUNTER — Telehealth: Payer: Self-pay | Admitting: Hematology

## 2022-08-30 ENCOUNTER — Inpatient Hospital Stay: Payer: 59 | Admitting: Hematology

## 2022-08-30 NOTE — Telephone Encounter (Signed)
Per 9/20 in basket, attempted to call pt but no voicemail available, will try to call again

## 2022-09-15 ENCOUNTER — Inpatient Hospital Stay: Payer: 59 | Admitting: Hematology

## 2022-10-09 ENCOUNTER — Other Ambulatory Visit: Payer: Self-pay | Admitting: Family Medicine

## 2022-10-09 DIAGNOSIS — R1011 Right upper quadrant pain: Secondary | ICD-10-CM

## 2022-10-16 ENCOUNTER — Other Ambulatory Visit: Payer: Self-pay

## 2022-10-17 ENCOUNTER — Emergency Department (HOSPITAL_COMMUNITY)
Admission: EM | Admit: 2022-10-17 | Discharge: 2022-10-18 | Disposition: A | Discharge status: Home / Self Care | Payer: 59 | Attending: Emergency Medicine | Admitting: Emergency Medicine

## 2022-10-17 ENCOUNTER — Emergency Department (HOSPITAL_COMMUNITY): Payer: 59

## 2022-10-17 ENCOUNTER — Emergency Department (HOSPITAL_BASED_OUTPATIENT_CLINIC_OR_DEPARTMENT_OTHER): Payer: 59

## 2022-10-17 ENCOUNTER — Encounter (HOSPITAL_COMMUNITY): Payer: Self-pay

## 2022-10-17 ENCOUNTER — Other Ambulatory Visit: Payer: Self-pay

## 2022-10-17 DIAGNOSIS — J449 Chronic obstructive pulmonary disease, unspecified: Secondary | ICD-10-CM | POA: Diagnosis not present

## 2022-10-17 DIAGNOSIS — I10 Essential (primary) hypertension: Secondary | ICD-10-CM | POA: Insufficient documentation

## 2022-10-17 DIAGNOSIS — Z7951 Long term (current) use of inhaled steroids: Secondary | ICD-10-CM | POA: Insufficient documentation

## 2022-10-17 DIAGNOSIS — M5432 Sciatica, left side: Secondary | ICD-10-CM | POA: Diagnosis not present

## 2022-10-17 DIAGNOSIS — D649 Anemia, unspecified: Secondary | ICD-10-CM | POA: Insufficient documentation

## 2022-10-17 DIAGNOSIS — R52 Pain, unspecified: Secondary | ICD-10-CM

## 2022-10-17 DIAGNOSIS — Z79899 Other long term (current) drug therapy: Secondary | ICD-10-CM | POA: Diagnosis not present

## 2022-10-17 DIAGNOSIS — M79605 Pain in left leg: Secondary | ICD-10-CM | POA: Diagnosis present

## 2022-10-17 LAB — CBC WITH DIFFERENTIAL/PLATELET
Abs Immature Granulocytes: 0.01 10*3/uL (ref 0.00–0.07)
Basophils Absolute: 0 10*3/uL (ref 0.0–0.1)
Basophils Relative: 0 %
Eosinophils Absolute: 0.1 10*3/uL (ref 0.0–0.5)
Eosinophils Relative: 1 %
HCT: 28.5 % — ABNORMAL LOW (ref 39.0–52.0)
Hemoglobin: 9.2 g/dL — ABNORMAL LOW (ref 13.0–17.0)
Immature Granulocytes: 0 %
Lymphocytes Relative: 51 %
Lymphs Abs: 2.1 10*3/uL (ref 0.7–4.0)
MCH: 28 pg (ref 26.0–34.0)
MCHC: 32.3 g/dL (ref 30.0–36.0)
MCV: 86.9 fL (ref 80.0–100.0)
Monocytes Absolute: 1 10*3/uL (ref 0.1–1.0)
Monocytes Relative: 23 %
Neutro Abs: 1.1 10*3/uL — ABNORMAL LOW (ref 1.7–7.7)
Neutrophils Relative %: 25 %
Platelets: 286 10*3/uL (ref 150–400)
RBC: 3.28 MIL/uL — ABNORMAL LOW (ref 4.22–5.81)
RDW: 35.6 % — ABNORMAL HIGH (ref 11.5–15.5)
Smear Review: NORMAL
WBC: 4.2 10*3/uL (ref 4.0–10.5)
nRBC: 1.2 % — ABNORMAL HIGH (ref 0.0–0.2)

## 2022-10-17 LAB — BASIC METABOLIC PANEL
Anion gap: 9 (ref 5–15)
BUN: 10 mg/dL (ref 8–23)
CO2: 29 mmol/L (ref 22–32)
Calcium: 9.1 mg/dL (ref 8.9–10.3)
Chloride: 102 mmol/L (ref 98–111)
Creatinine, Ser: 0.62 mg/dL (ref 0.61–1.24)
GFR, Estimated: >60 mL/min (ref >60)
Glucose, Bld: 96 mg/dL (ref 70–99)
Potassium: 4.3 mmol/L (ref 3.5–5.1)
Sodium: 140 mmol/L (ref 135–145)

## 2022-10-17 NOTE — ED Provider Triage Note (Signed)
Emergency Medicine Provider Triage Evaluation Note  Vincent Marquez , a 71 y.o. male  was evaluated in triage.  Pt complains of left lower extremity pain associated with edema.  Patient concerned he has a DVT.  No history of DVTs.  Patient not currently on blood thinners.  Patient states pain radiates from left hip down to left thigh.  No injury.  Review of Systems  Positive: Leg pain Negative: fever  Physical Exam  BP 108/81 (BP Location: Left Arm)   Pulse 98   Temp 98.2 F (36.8 C) (Oral)   Resp 18   SpO2 100%  Gen:   Awake, no distress   Resp:  Normal effort  MSK:   Moves extremities without difficulty  Other:  Bony tenderness to left hip. TTP throughout L thigh  Medical Decision Making  Medically screening exam initiated at 2:26 PM.  Appropriate orders placed.  Vincent Marquez was informed that the remainder of the evaluation will be completed by another provider, this initial triage assessment does not replace that evaluation, and the importance of remaining in the ED until their evaluation is complete.  Labs [place] to rule out DVT X-ray given bony tenderness   Suzy Bouchard, PA-C 10/17/22 1428

## 2022-10-17 NOTE — ED Triage Notes (Signed)
Patient complaining of pain to left side radiating to left thigh x 1 week. Denis trauma. Patient thinks may be related to acid reflux. Alert and oriented, denies trauma

## 2022-10-17 NOTE — Progress Notes (Signed)
Lower extremity venous left study completed.  Preliminary results relayed to [place], [place].  See CV Proc for preliminary results report.   Darlin Coco, RDMS, RVT

## 2022-10-18 DIAGNOSIS — M5432 Sciatica, left side: Secondary | ICD-10-CM | POA: Diagnosis not present

## 2022-10-18 MED ORDER — ORPHENADRINE CITRATE ER 100 MG PO TB12: 100.0000 mg | ORAL_TABLET | Freq: Two times a day (BID) | ORAL | 0 refills | Status: DC

## 2022-10-18 MED ORDER — PREDNISONE 20 MG PO TABS
60.0000 mg | ORAL_TABLET | Freq: Once | ORAL | Status: AC
  Administered 2022-10-18: 60 mg via ORAL
  Filled 2022-10-18: qty 3

## 2022-10-18 MED ORDER — PREDNISONE 50 MG PO TABS: 50.0000 mg | ORAL_TABLET | Freq: Every day | ORAL | 0 refills | Status: DC

## 2022-10-18 MED ORDER — CYCLOBENZAPRINE HCL 10 MG PO TABS
10.0000 mg | ORAL_TABLET | Freq: Once | ORAL | Status: AC
  Administered 2022-10-18: 10 mg via ORAL
  Filled 2022-10-18: qty 1

## 2022-10-18 MED ORDER — NAPROXEN 250 MG PO TABS
375.0000 mg | ORAL_TABLET | Freq: Once | ORAL | Status: AC
  Administered 2022-10-18: 375 mg via ORAL
  Filled 2022-10-18: qty 2

## 2022-10-18 MED ORDER — NAPROXEN 375 MG PO TABS: 375.0000 mg | ORAL_TABLET | Freq: Two times a day (BID) | ORAL | 0 refills | Status: DC

## 2022-10-18 MED ORDER — OXYCODONE-ACETAMINOPHEN 5-325 MG PO TABS
1.0000 | ORAL_TABLET | Freq: Once | ORAL | Status: AC
  Administered 2022-10-18: 1 via ORAL
  Filled 2022-10-18: qty 1

## 2022-10-18 MED ORDER — OXYCODONE HCL 5 MG PO TABS: 5.0000 mg | ORAL_TABLET | ORAL | 0 refills | Status: DC | PRN

## 2022-10-18 NOTE — ED Provider Notes (Signed)
[facility] Provider Note   CSN: [id number] Arrival date & time: 10/17/22  1214     History  No chief complaint on file.   Vincent Marquez is a 71 y.o. male.  The history is provided by the patient.  He has history of hypertension, GERD, anemia, COPD and comes in complaining of pain in the left upper leg for the last 3 weeks.  Pain starts just superior to his knee and shoots up towards his hip and also down into his calf.  His physician prescribed ibuprofen which did give some temporary relief.  Also, acetaminophen give some temporary relief.  He denies any weakness or numbness and denies any bowel or bladder dysfunction.  He is concerned about a possible blood clot in his leg.  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   Home Medications Prior to Admission medications   Medication Sig Start Date End Date Taking? Authorizing Provider  naproxen (NAPROSYN) 375 MG tablet Take 1 tablet (375 mg total) by mouth 2 (two) times daily. 49/6/75  Yes Delora Fuel, MD  orphenadrine (NORFLEX) 100 MG tablet Take 1 tablet (100 mg total) by mouth 2 (two) times daily. 91/6/38  Yes Delora Fuel, MD  oxyCODONE (ROXICODONE) 5 MG immediate release tablet Take 1 tablet (5 mg total) by mouth every 4 (four) hours as needed for severe pain. 46/6/59  Yes Delora Fuel, MD  predniSONE (DELTASONE) 50 MG tablet Take 1 tablet (50 mg total) by mouth daily. 93/5/70  Yes Delora Fuel, MD  albuterol (PROVENTIL HFA;VENTOLIN HFA) 108 (90 Base) MCG/ACT inhaler Inhale 1-2 puffs into the lungs every 6 (six) hours as needed for wheezing or shortness of breath. 01/15/19   Melynda Ripple, MD  Fluticasone-Umeclidin-Vilant 100-62.5-25 MCG/INH AEPB Inhale into the lungs.    [provider]  pantoprazole (PROTONIX) 40 MG tablet Take 1 tablet (40 mg total) by  mouth 2 (two) times daily. 04/26/22   Zehr, Laban Emperor, PA-C      Allergies    Patient has no known allergies.    Review of Systems   Review of Systems  All other systems reviewed and are negative.   Physical Exam Updated Vital Signs BP (!) 131/95 (BP Location: Right Arm)   Pulse 95   Temp 97.9 F (36.6 C)   Resp 17   SpO2 96%  Physical Exam Vitals and nursing note reviewed.   71 year old male, resting comfortably and in no acute distress. Vital signs are significant for borderline elevated blood pressure. Oxygen saturation is 96%, which is normal. Head is normocephalic and atraumatic. PERRLA, EOMI. Oropharynx is clear. Neck is nontender and supple without adenopathy or JVD. Back is nontender in the midline.  There is moderate left paralumbar spasm.  Straight leg raise is positive on the left at 45 degrees, negative on the right. Lungs are clear without rales, wheezes, or rhonchi. Chest is nontender. Heart has regular rate and rhythm without murmur. Abdomen is soft, flat, nontender without masses or hepatosplenomegaly and peristalsis is normoactive. Extremities: No swelling or deformity noted.  Full range of motion present at the knees and hips without pain.  No peripheral edema. Skin is warm and dry without rash. Neurologic: Mental status is normal, cranial nerves are intact, strength is 5/5 in all 4 extremities, no sensory deficits.  ED Results / Procedures / Treatments   Labs (all labs ordered are listed, but only abnormal results are displayed) Labs Reviewed  CBC WITH DIFFERENTIAL/PLATELET - Abnormal; Notable for the following components:      Result Value   RBC 3.28 (*)    Hemoglobin 9.2 (*)    HCT 28.5 (*)    RDW 35.6 (*)    nRBC 1.2 (*)    Neutro Abs 1.1 (*)    All other components within normal limits  BASIC METABOLIC PANEL   Radiology VAS [place] LOWER EXTREMITY VENOUS (DVT) (7a-7p)  Result Date: 10/17/2022  Lower Venous DVT Study Patient Name:  Vincent Marquez   Date of Exam:   10/17/2022 Medical Rec #: [id number]      Accession #:    [id number] Date of Birth: 08-07-1951      Patient Gender: M Patient Age:   68 years Exam Location:  [facility] Procedure:      VAS [place] LOWER EXTREMITY VENOUS (DVT) Referring Phys: Charmaine Downs --------------------------------------------------------------------------------  Indications: Left hip and thigh pain.  Comparison Study: No prior studies. Performing Technologist: Darlin Coco RDMS, RVT  Examination Guidelines: A complete evaluation includes B-mode imaging, spectral Doppler, color Doppler, and power Doppler as needed of all accessible portions of each vessel. Bilateral testing is considered an integral part of a complete examination. Limited examinations for reoccurring indications may be performed as noted. The reflux portion of the exam is performed with the patient in reverse Trendelenburg.  +-----+---------------+---------+-----------+----------+--------------+ RIGHTCompressibilityPhasicitySpontaneityPropertiesThrombus Aging +-----+---------------+---------+-----------+----------+--------------+ CFV  Full           Yes      Yes                                 +-----+---------------+---------+-----------+----------+--------------+   +---------+---------------+---------+-----------+----------+--------------+ LEFT     CompressibilityPhasicitySpontaneityPropertiesThrombus Aging +---------+---------------+---------+-----------+----------+--------------+ CFV      Full           Yes      Yes                                 +---------+---------------+---------+-----------+----------+--------------+ SFJ      Full                                                        +---------+---------------+---------+-----------+----------+--------------+ FV Prox  Full                                                        +---------+---------------+---------+-----------+----------+--------------+ FV  Mid   Full                                                        +---------+---------------+---------+-----------+----------+--------------+ FV DistalFull                                                        +---------+---------------+---------+-----------+----------+--------------+  PFV      Full                                                        +---------+---------------+---------+-----------+----------+--------------+ POP      Full           Yes      Yes                                 +---------+---------------+---------+-----------+----------+--------------+ PTV      Full                                                        +---------+---------------+---------+-----------+----------+--------------+ PERO     Full                                                        +---------+---------------+---------+-----------+----------+--------------+     Summary: RIGHT: - No evidence of common femoral vein obstruction.  LEFT: - There is no evidence of deep vein thrombosis in the lower extremity.  - No cystic structure found in the popliteal fossa.  *See table(s) above for measurements and observations. Electronically signed by Deitra Mayo MD on 10/17/2022 at 6:08:14 PM.    Final    DG Hip Unilat W or Wo Pelvis 2-3 Views Left  Result Date: 10/17/2022 CLINICAL DATA:  Acute left hip pain without known injury. EXAM: DG HIP (WITH OR WITHOUT PELVIS) 2-3V LEFT COMPARISON:  None Available. FINDINGS: There is no evidence of hip fracture or dislocation. There is no evidence of arthropathy or other focal bone abnormality. IMPRESSION: Negative. Electronically Signed   By: Marijo Conception M.D.   On: 10/17/2022 15:23    Procedures Procedures    Medications Ordered in ED Medications  naproxen (NAPROSYN) tablet 375 mg (has no administration in time range)  cyclobenzaprine (FLEXERIL) tablet 10 mg (has no administration in time range)  predniSONE (DELTASONE) tablet 60  mg (has no administration in time range)  oxyCODONE-acetaminophen (PERCOCET/ROXICET) 5-325 MG per tablet 1 tablet (has no administration in time range)    ED Course/ Medical Decision Making/ A&P                           Medical Decision Making Risk Prescription drug management.   Left leg pain which, per my exam, seems most likely to be sciatica.  Tests ordered from triage include CBC, basic metabolic panel, venous Doppler.  I have reviewed and interpreted his laboratory test, and my interpretation is stable anemia.  Venous Dopplers negative for DVT or Baker's cyst.  I have explained these results to the patient.  I am discharging him with a prescription for naproxen, orphenadrine, prednisone.  I have ordered and initial dose of prednisone, naproxen, cyclobenzaprine.  I have recommended follow-up with PCP to consider physical therapy.  Final Clinical Impression(s) / ED Diagnoses Final diagnoses:  Left sided sciatica  Normochromic normocytic anemia    Rx / DC Orders ED Discharge Orders          Ordered    naproxen (NAPROSYN) 375 MG tablet  2 times daily        10/18/22 0157    orphenadrine (NORFLEX) 100 MG tablet  2 times daily        10/18/22 0157    predniSONE (DELTASONE) 50 MG tablet  Daily        10/18/22 0157    oxyCODONE (ROXICODONE) 5 MG immediate release tablet  Every 4 hours PRN        10/18/22 2440              Delora Fuel, MD 10/06/24 [phone]

## 2022-10-18 NOTE — Discharge Instructions (Addendum)
Apply ice to your lower back for 30 minutes at a time, 4 times a day.  If you need pain relief in spite of taking the naproxen, add acetaminophen.  If you still need additional pain relief, then add the oxycodone.  Please follow-up with your primary care provider to see if physical therapy may be helpful.

## 2022-10-19 ENCOUNTER — Other Ambulatory Visit: Payer: 59

## 2022-11-20 ENCOUNTER — Emergency Department (HOSPITAL_COMMUNITY)
Admission: EM | Admit: 2022-11-20 | Discharge: 2022-11-20 | Discharge status: Against Medical Advice | Payer: 59 | Attending: Emergency Medicine | Admitting: Emergency Medicine

## 2022-11-20 ENCOUNTER — Encounter (HOSPITAL_COMMUNITY): Payer: Self-pay

## 2022-11-20 ENCOUNTER — Other Ambulatory Visit: Payer: Self-pay

## 2022-11-20 ENCOUNTER — Emergency Department (HOSPITAL_COMMUNITY): Payer: 59

## 2022-11-20 DIAGNOSIS — Y92410 Unspecified street and highway as the place of occurrence of the external cause: Secondary | ICD-10-CM | POA: Diagnosis not present

## 2022-11-20 DIAGNOSIS — Z5321 Procedure and treatment not carried out due to patient leaving prior to being seen by health care provider: Secondary | ICD-10-CM | POA: Insufficient documentation

## 2022-11-20 DIAGNOSIS — M545 Low back pain, unspecified: Secondary | ICD-10-CM | POA: Insufficient documentation
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
## 2023-09-13 ENCOUNTER — Other Ambulatory Visit: Payer: Self-pay | Admitting: Family Medicine

## 2023-09-13 DIAGNOSIS — K760 Fatty (change of) liver, not elsewhere classified: Secondary | ICD-10-CM

## 2023-09-13 DIAGNOSIS — F17210 Nicotine dependence, cigarettes, uncomplicated: Secondary | ICD-10-CM

## 2023-09-14 ENCOUNTER — Telehealth: Payer: Self-pay | Admitting: Hematology

## 2023-09-19 ENCOUNTER — Other Ambulatory Visit: Payer: Self-pay

## 2023-09-19 DIAGNOSIS — D649 Anemia, unspecified: Secondary | ICD-10-CM

## 2023-09-20 ENCOUNTER — Inpatient Hospital Stay: Payer: 59

## 2023-09-20 ENCOUNTER — Inpatient Hospital Stay: Payer: 59 | Attending: Hematology | Admitting: Hematology

## 2023-09-20 NOTE — Telephone Encounter (Signed)
Scheduled appointments per referral. Patient is aware of the appointment time and date as well as the address. Patient was informed to arrive 10-15 minutes prior with updated insurance information. All questions were answered.

## 2023-09-27 ENCOUNTER — Other Ambulatory Visit: Payer: 59

## 2023-10-01 ENCOUNTER — Ambulatory Visit
Admission: RE | Admit: 2023-10-01 | Discharge: 2023-10-01 | Disposition: A | Discharge status: Home / Self Care | Payer: 59 | Source: Ambulatory Visit | Attending: Family Medicine | Admitting: Family Medicine

## 2023-10-01 DIAGNOSIS — F17210 Nicotine dependence, cigarettes, uncomplicated: Secondary | ICD-10-CM

## 2023-10-12 ENCOUNTER — Inpatient Hospital Stay: Payer: 59 | Admitting: Oncology

## 2023-10-12 ENCOUNTER — Inpatient Hospital Stay: Payer: 59

## 2023-10-12 ENCOUNTER — Telehealth: Payer: Self-pay | Admitting: Oncology

## 2023-10-12 NOTE — Progress Notes (Deleted)
[facility]  HEMATOLOGY CLINIC CONSULTATION NOTE    PATIENT NAME: Vincent Marquez   MR#: [id number] DOB: 1951-07-30  DATE OF SERVICE: 10/12/2023  Patient Care Team: Ellyn Hack, MD as PCP - General (Family Medicine)  REASON FOR CONSULTATION/ CHIEF COMPLAINT:  Evaluation of anemia.  HISTORY OF PRESENT ILLNESS:  Vincent Marquez is a 72 y.o. 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    Discussed the use of AI scribe software for clinical note transcription with the patient, who gave verbal consent to proceed.   On 05/03/2023, labs at his PCPs office showed hemoglobin of 8.6, hematocrit 27.7, MCV 90.8.  White count was 4100 with normal differential, platelet count normal at 354,000.  Vitamin B12, folate were normal.  In November 2023, iron studies showed increased iron saturation of 96%, ferritin also increased at 894.   He was referred to [place] for evaluation of anemia.  ***He was found to have abnormal CBC from *** ***He denies recent chest pain on exertion, shortness of breath on minimal exertion, pre-syncopal episodes, or palpitations. ***He had not noticed any recent bleeding such as epistaxis, hematuria or hematochezia ***The patient denies over the counter NSAID ingestion. He is not *** on antiplatelets agents. His last colonoscopy was *** ***He had no prior history or diagnosis of cancer. His age appropriate screening programs are up-to-date. ***He denies any pica and eats a variety of diet. ***He never donated blood or received blood transfusion ***The patient was prescribed oral iron supplements and he takes ***  MEDICAL HISTORY:  Past Medical History:  Diagnosis Date   Anemia    COPD (chronic obstructive pulmonary disease) (HCC)    GERD (gastroesophageal  reflux disease)    Hypertension     SURGICAL HISTORY: Past Surgical History:  Procedure Laterality Date   COLONOSCOPY     MULTIPLE TOOTH EXTRACTIONS      SOCIAL HISTORY: He reports that he has been smoking cigarettes. He has a 50 pack-year smoking history. He has never used smokeless tobacco. He reports current alcohol use. He reports that he does not use drugs. Social History   Socioeconomic History   Marital status: Single    Spouse name: Not on file   Number of children: 8   Years of education: Not on file   Highest education level: Not on file  Occupational History   Not on file  Tobacco Use   Smoking status: Every Day    Current packs/day: 1.00    Average packs/day: 1 pack/day for 50.0 years (50.0 ttl pk-yrs)    Types: Cigarettes   Smokeless tobacco: Never  Vaping Use   Vaping status: Never Used  Substance and Sexual Activity   Alcohol use: Yes    Comment: weekend drinker for a few beers   Drug use: No   Sexual activity: Not on file  Other Topics Concern   Not on file  Social History Narrative   Corporate investment banker    Social Determinants of Health   Financial Resource Strain: Not on file  Food Insecurity: Not on file  Transportation Needs: Not on file  Physical Activity: Not on file  Stress: Not on file  Social Connections: Not on file  Intimate Partner Violence: Not on file    FAMILY HISTORY: Family History  Problem Relation Age of Onset   Cancer Mother    Cancer Sister        gastric cancer   Other Sister        Unknown stomach issues   Cancer Sister        gastric cancer   Other Sister        Stomach issues, possibly cancer   Colon cancer Neg Hx    Esophageal cancer Neg Hx    Rectal cancer Neg Hx    Stomach cancer Neg Hx     ALLERGIES:  He has No Known Allergies.  MEDICATIONS:  Current Outpatient Medications  Medication Sig Dispense Refill   albuterol (PROVENTIL HFA;VENTOLIN HFA) 108 (90 Base) MCG/ACT inhaler Inhale 1-2 puffs into the  lungs every 6 (six) hours as needed for wheezing or shortness of breath. 1 Inhaler 0   Fluticasone-Umeclidin-Vilant 100-62.5-25 MCG/INH AEPB Inhale into the lungs.     naproxen (NAPROSYN) 375 MG tablet Take 1 tablet (375 mg total) by mouth 2 (two) times daily. 30 tablet 0   orphenadrine (NORFLEX) 100 MG tablet Take 1 tablet (100 mg total) by mouth 2 (two) times daily. 30 tablet 0   oxyCODONE (ROXICODONE) 5 MG immediate release tablet Take 1 tablet (5 mg total) by mouth every 4 (four) hours as needed for severe pain. 10 tablet 0   pantoprazole (PROTONIX) 40 MG tablet Take 1 tablet (40 mg total) by mouth 2 (two) times daily. 180 tablet 2   predniSONE (DELTASONE) 50 MG tablet Take 1 tablet (50 mg total) by mouth daily. 5 tablet 0   No current facility-administered medications for this visit.    REVIEW OF SYSTEMS:    Review of Systems - Oncology  All other pertinent systems were reviewed and were negative except as mentioned above.  PHYSICAL EXAMINATION:  ECOG PERFORMANCE STATUS: {CHL ONC ECOG [phone]}  There were no vitals filed for this visit. There were no vitals filed for this visit.  Physical Exam  ***  LABORATORY DATA:   I have reviewed the data as listed.  No results found for any visits on 10/12/23.  Lab Results  Component Value Date   WBC 4.2 10/17/2022   NEUTROABS 1.1 (L) 10/17/2022   HGB 9.2 (L) 10/17/2022   HCT 28.5 (L) 10/17/2022   MCV 86.9 10/17/2022   PLT 286 10/17/2022    Lab Results  Component Value Date   IRON 263 (H) 06/30/2022   TIBC 262 06/30/2022   FERRITIN 746 (H) 06/30/2022    Recent Labs    10/17/22 1449  NA 140  K 4.3  CL 102  CO2 29  GLUCOSE 96  BUN 10  CREATININE 0.62  CALCIUM 9.1  GFRNONAA >60    No results found for this or any previous visit (from the past 72 hour(s)).   RADIOGRAPHIC STUDIES:  I have personally reviewed the radiological images as listed and agree with the findings in the report.  CT CHEST LUNG CA  SCREEN LOW DOSE W/O CM  Result Date: 10/12/2023 CLINICAL DATA:  72 year old male with 40 pack-year history of smoking. Lung cancer screening. EXAM: CT CHEST WITHOUT CONTRAST LOW-DOSE FOR LUNG CANCER SCREENING TECHNIQUE: Multidetector CT imaging of the chest was performed following the standard protocol without IV contrast. RADIATION DOSE REDUCTION: This exam was performed according to the departmental dose-optimization program which includes automated exposure control, adjustment of the mA and/or kV according to patient size and/or use of iterative reconstruction technique. COMPARISON:  None Available. FINDINGS:  Cardiovascular: The heart size is normal. No substantial pericardial effusion. Coronary artery calcification is evident. Mild atherosclerotic calcification is noted in the wall of the thoracic aorta. Mediastinum/Nodes: No mediastinal lymphadenopathy. No evidence for gross hilar lymphadenopathy although assessment is limited by the lack of intravenous contrast on the current study. The esophagus has normal imaging features. There is no axillary lymphadenopathy. Lungs/Pleura: Centrilobular and paraseptal emphysema evident. No suspicious pulmonary nodule or mass. Atelectasis or scarring noted in the lingula. No pleural effusion. Upper Abdomen: Unremarkable Musculoskeletal: No worrisome lytic or sclerotic osseous abnormality. Old bilateral rib fractures evident. IMPRESSION: Lung-RADS 1, negative. Continue annual screening with low-dose chest CT without contrast in 12 months. Aortic Atherosclerosis (ICD10-I70.0) and Emphysema (ICD10-J43.9). Electronically Signed   By: Kennith Center M.D.   On: 10/12/2023 08:38     ASSESSMENT & PLAN:   No problem-specific Assessment & Plan notes found for this encounter.   Since the cause of anemia seems to be obvious from iron deficiency, I am not pursuing extensive workup at this time.  If inadequate response to IV iron is noted, we will pursue workup to rule out other  etiologies.  No orders of the defined types were placed in this encounter.    The total time spent in the appointment was {CHL ONC TIME VISIT - GNFAO:[id number]} encounter with patients including review of chart and various tests results, discussions about plan of care and coordination of care plan.  I reviewed lab results and outside records for this visit and discussed relevant results with the patient. Diagnosis, plan of care and treatment options were also discussed in detail with the patient. Opportunity provided to ask questions and answers provided to his apparent satisfaction. Provided instructions to call our clinic with any problems, questions or concerns prior to return visit. I recommended to continue follow-up with PCP and sub-specialists. He verbalized understanding and agreed with the plan. No barriers to learning was detected.   Future Appointments  Date Time Provider Department Center  10/12/2023  1:00 PM Fredis Malkiewicz, MD [facility] None  10/12/2023  2:00 PM [facility] LAB [facility] None  10/16/2023  9:15 AM [facility] [place] 2 [id number] [facility]     Meryl Crutch, MD [facility] [address] [place] [address] Dept: [phone] Dept Fax: [phone]  10/12/2023 10:25 AM   This document was completed utilizing speech recognition software. Grammatical errors, random word insertions, pronoun errors, and incomplete sentences are an occasional consequence of this system due to software limitations, ambient noise, and hardware issues. Any formal questions or concerns about the content, text or information contained within the body of this dictation should be directly addressed to the provider for clarification.

## 2023-10-12 NOTE — Telephone Encounter (Signed)
Rescheduled appointment per incoming call from patient due to family emergency. Patient is aware of the changes made to his upcoming appointment.

## 2023-10-16 ENCOUNTER — Other Ambulatory Visit: Payer: 59

## 2023-10-26 ENCOUNTER — Other Ambulatory Visit: Payer: 59

## 2023-11-04 NOTE — Progress Notes (Deleted)
Patient Care Team: Ellyn Hack, MD as PCP - General (Family Medicine)   CHIEF COMPLAINT: Follow up hemosiderosis   CURRENT THERAPY: None  INTERVAL HISTORY Vincent Marquez is a 72 yo male with PMH including GERD, low testosterone, and Hep C who presents to re-establish care. Previously seen by Dr. Mosetta Putt in 06/2022 and phone visit in 07/2022, work up revealed hemosiderosis, and a bone marrow biopsy was recommended for anemia but he did not schedule and eventually lost follow up.   ROS   Past Medical History:  Diagnosis Date   Anemia    COPD (chronic obstructive pulmonary disease) (HCC)    GERD (gastroesophageal reflux disease)    Hypertension      Past Surgical History:  Procedure Laterality Date   COLONOSCOPY     MULTIPLE TOOTH EXTRACTIONS       Outpatient Encounter Medications as of 11/07/2023  Medication Sig   albuterol (PROVENTIL HFA;VENTOLIN HFA) 108 (90 Base) MCG/ACT inhaler Inhale 1-2 puffs into the lungs every 6 (six) hours as needed for wheezing or shortness of breath.   Fluticasone-Umeclidin-Vilant 100-62.5-25 MCG/INH AEPB Inhale into the lungs.   naproxen (NAPROSYN) 375 MG tablet Take 1 tablet (375 mg total) by mouth 2 (two) times daily.   orphenadrine (NORFLEX) 100 MG tablet Take 1 tablet (100 mg total) by mouth 2 (two) times daily.   oxyCODONE (ROXICODONE) 5 MG immediate release tablet Take 1 tablet (5 mg total) by mouth every 4 (four) hours as needed for severe pain.   pantoprazole (PROTONIX) 40 MG tablet Take 1 tablet (40 mg total) by mouth 2 (two) times daily.   predniSONE (DELTASONE) 50 MG tablet Take 1 tablet (50 mg total) by mouth daily.   No facility-administered encounter medications on file as of 11/07/2023.     There were no vitals filed for this visit. There is no height or weight on file to calculate BMI.   PHYSICAL EXAM GENERAL:alert, no distress and comfortable SKIN: no rash  EYES: sclera clear NECK: without mass LYMPH:  no palpable cervical or  supraclavicular lymphadenopathy  LUNGS: clear with normal breathing effort HEART: regular rate & rhythm, no lower extremity edema ABDOMEN: abdomen soft, non-tender and normal bowel sounds NEURO: alert & oriented x 3 with fluent speech, no focal motor/sensory deficits Breast exam:  PAC without erythema    CBC    Component Value Date/Time   WBC 4.2 10/17/2022 1449   RBC 3.28 (L) 10/17/2022 1449   HGB 9.2 (L) 10/17/2022 1449   HGB 9.3 (L) 06/30/2022 1227   HCT 28.5 (L) 10/17/2022 1449   HCT 29.4 (L) 06/30/2022 1227   PLT 286 10/17/2022 1449   PLT 383 06/30/2022 1227   MCV 86.9 10/17/2022 1449   MCH 28.0 10/17/2022 1449   MCHC 32.3 10/17/2022 1449   RDW 35.6 (H) 10/17/2022 1449   LYMPHSABS 2.1 10/17/2022 1449   MONOABS 1.0 10/17/2022 1449   EOSABS 0.1 10/17/2022 1449   BASOSABS 0.0 10/17/2022 1449     CMP     Component Value Date/Time   NA 140 10/17/2022 1449   K 4.3 10/17/2022 1449   CL 102 10/17/2022 1449   CO2 29 10/17/2022 1449   GLUCOSE 96 10/17/2022 1449   BUN 10 10/17/2022 1449   CREATININE 0.62 10/17/2022 1449   CALCIUM 9.1 10/17/2022 1449   PROT 6.9 01/09/2018 1130   ALBUMIN 3.8 01/09/2018 1130   AST 22 01/09/2018 1130   ALT 17 01/09/2018 1130   ALKPHOS  73 01/09/2018 1130   BILITOT 1.3 (H) 01/09/2018 1130   GFRNONAA >60 10/17/2022 1449   GFRAA >60 01/09/2018 1130     ASSESSMENT & PLAN:  PLAN:  No orders of the defined types were placed in this encounter.     All questions were answered. The patient knows to call the clinic with any problems, questions or concerns. No barriers to learning were detected. I spent *** counseling the patient face to face. The total time spent in the appointment was *** and more than 50% was on counseling, review of test results, and coordination of care.   Vincent Glad, NP-C @DATE @

## 2023-11-06 ENCOUNTER — Other Ambulatory Visit: Payer: 59

## 2023-11-07 ENCOUNTER — Inpatient Hospital Stay: Payer: 59 | Admitting: Nurse Practitioner

## 2023-11-07 ENCOUNTER — Inpatient Hospital Stay: Payer: 59

## 2023-11-13 ENCOUNTER — Telehealth: Payer: Self-pay

## 2023-11-13 NOTE — Telephone Encounter (Signed)
Rescheduled appointments per room/resource. Patient is aware of the changes made to his upcoming appointments.

## 2023-11-16 ENCOUNTER — Other Ambulatory Visit: Payer: 59

## 2023-11-22 ENCOUNTER — Other Ambulatory Visit: Payer: 59

## 2023-11-22 ENCOUNTER — Inpatient Hospital Stay: Payer: 59

## 2023-11-22 ENCOUNTER — Inpatient Hospital Stay: Payer: 59 | Attending: Hematology

## 2023-11-26 NOTE — Progress Notes (Deleted)
Patient Care Team: Ellyn Hack, MD as PCP - General (Family Medicine)  Clinic Day:  11/26/2023  Referring physician: Ellyn Hack, MD  ASSESSMENT & PLAN:   Assessment & Plan: Hemosiderosis -presented with intermittent abdominal pain, abdomen MRI 11/15/21 showed iron deposition in liver, spleen and bone marrow. -he took oral iron for 1 year in 2019 for anemia.  Has never received IV iron or blood transfusion that he is aware of. -hemochromatosis genetic testing on 06/30/22 negative. I reviewed with him  -other labs from 7/21 showed-- ferritin 746, iron 263 with saturation 101%, Hg 9.3, WBC and platelet count normal, folate and B12, methylmalonic acid levels were normal, erythropoietin level elevated at 141.22mIU/ml.  His testosterone level was low, which likely contributed to his anemia. --I discussed the findings thus far with him today.  A bone marrow biopsy for his anemia was recommended, rule out a primary bone marrow disease, and confirm excessive iron deposit in bone marrow.  -He is not a candidate for phlebotomy due to his anemia.  Will likely start iron chelation after next visit, pending bone biopsy results.    The patient understands the plans discussed today and is in agreement with them.  He knows to contact our office if he develops concerns prior to his next appointment.  I provided *** minutes of face-to-face time during this encounter and > 50% was spent counseling as documented under my assessment and plan.    Carlean Jews, NP  [facility] - A DEPT [facility] [address] [place] [address] Dept: [phone] Dept Fax: [phone]   No orders of the defined types were placed in this encounter.     CHIEF COMPLAINT:  CC: Hemosiderosis  Current Treatment:  pending  INTERVAL HISTORY:  Vincent Marquez is here today for repeat clinical assessment.  Was last seen by Dr. Mosetta Putt on 07/14/2022.   Discussed treatment with iron chelation.  A bone marrow biopsy was recommended to rule out causes of anemia.  Thus far, he has not presented for that procedure and has not received treatment for hemosiderosis.  He has history of hepatitis C, untreated.  He denies fevers or chills. He denies pain. His appetite is good. His weight {Weight change:10426}.  I have reviewed the past medical history, past surgical history, social history and family history with the patient and they are unchanged from previous note.  ALLERGIES:  has no known allergies.  MEDICATIONS:  Current Outpatient Medications  Medication Sig Dispense Refill   albuterol (PROVENTIL HFA;VENTOLIN HFA) 108 (90 Base) MCG/ACT inhaler Inhale 1-2 puffs into the lungs every 6 (six) hours as needed for wheezing or shortness of breath. 1 Inhaler 0   Fluticasone-Umeclidin-Vilant 100-62.5-25 MCG/INH AEPB Inhale into the lungs.     naproxen (NAPROSYN) 375 MG tablet Take 1 tablet (375 mg total) by mouth 2 (two) times daily. 30 tablet 0   orphenadrine (NORFLEX) 100 MG tablet Take 1 tablet (100 mg total) by mouth 2 (two) times daily. 30 tablet 0   oxyCODONE (ROXICODONE) 5 MG immediate release tablet Take 1 tablet (5 mg total) by mouth every 4 (four) hours as needed for severe pain. 10 tablet 0   pantoprazole (PROTONIX) 40 MG tablet Take 1 tablet (40 mg total) by mouth 2 (two) times daily. 180 tablet 2   predniSONE (DELTASONE) 50 MG tablet Take 1 tablet (50 mg total) by mouth daily. 5 tablet 0  No current facility-administered medications for this visit.    HISTORY OF PRESENT ILLNESS:   Oncology History   No history exists.      REVIEW OF SYSTEMS:   Constitutional: Denies fevers, chills or abnormal weight loss Eyes: Denies blurriness of vision Ears, nose, mouth, throat, and face: Denies mucositis or sore throat Respiratory: Denies cough, dyspnea or wheezes Cardiovascular: Denies palpitation, chest discomfort or lower extremity  swelling Gastrointestinal:  Denies nausea, heartburn or change in bowel habits Skin: Denies abnormal skin rashes Lymphatics: Denies new lymphadenopathy or easy bruising Neurological:Denies numbness, tingling or new weaknesses Behavioral/Psych: Mood is stable, no new changes  All other systems were reviewed with the patient and are negative.   VITALS:  There were no vitals taken for this visit.  Wt Readings from Last 3 Encounters:  06/30/22 176 lb 6.4 oz (80 kg)  04/14/22 176 lb (79.8 kg)  04/13/22 176 lb 12.8 oz (80.2 kg)    There is no height or weight on file to calculate BMI.  Performance status (ECOG): {CHL ONC [id number]  PHYSICAL EXAM:   GENERAL:alert, no distress and comfortable SKIN: skin color, texture, turgor are normal, no rashes or significant lesions EYES: normal, Conjunctiva are pink and non-injected, sclera clear OROPHARYNX:no exudate, no erythema and lips, buccal mucosa, and tongue normal  NECK: supple, thyroid normal size, non-tender, without nodularity LYMPH:  no palpable lymphadenopathy in the cervical, axillary or inguinal LUNGS: clear to auscultation and percussion with normal breathing effort HEART: regular rate & rhythm and no murmurs and no lower extremity edema ABDOMEN:abdomen soft, non-tender and normal bowel sounds Musculoskeletal:no cyanosis of digits and no clubbing  NEURO: alert & oriented x 3 with fluent speech, no focal motor/sensory deficits  LABORATORY DATA:  I have reviewed the data as listed    Component Value Date/Time   NA 140 10/17/2022 1449   K 4.3 10/17/2022 1449   CL 102 10/17/2022 1449   CO2 29 10/17/2022 1449   GLUCOSE 96 10/17/2022 1449   BUN 10 10/17/2022 1449   CREATININE 0.62 10/17/2022 1449   CALCIUM 9.1 10/17/2022 1449   PROT 6.9 01/09/2018 1130   ALBUMIN 3.8 01/09/2018 1130   AST 22 01/09/2018 1130   ALT 17 01/09/2018 1130   ALKPHOS 73 01/09/2018 1130   BILITOT 1.3 (H) 01/09/2018 1130   GFRNONAA >60 10/17/2022  1449   GFRAA >60 01/09/2018 1130    No results found for: "SPEP", "UPEP"  Lab Results  Component Value Date   WBC 4.2 10/17/2022   NEUTROABS 1.1 (L) 10/17/2022   HGB 9.2 (L) 10/17/2022   HCT 28.5 (L) 10/17/2022   MCV 86.9 10/17/2022   PLT 286 10/17/2022      Chemistry      Component Value Date/Time   NA 140 10/17/2022 1449   K 4.3 10/17/2022 1449   CL 102 10/17/2022 1449   CO2 29 10/17/2022 1449   BUN 10 10/17/2022 1449   CREATININE 0.62 10/17/2022 1449      Component Value Date/Time   CALCIUM 9.1 10/17/2022 1449   ALKPHOS 73 01/09/2018 1130   AST 22 01/09/2018 1130   ALT 17 01/09/2018 1130   BILITOT 1.3 (H) 01/09/2018 1130       RADIOGRAPHIC STUDIES: I have personally reviewed the radiological images as listed and agreed with the findings in the report. No results found.

## 2023-11-26 NOTE — Assessment & Plan Note (Deleted)
-  presented with intermittent abdominal pain, abdomen MRI 11/15/21 showed iron deposition in liver, spleen and bone marrow. -he took oral iron for 1 year in 2019 for anemia.  Has never received IV iron or blood transfusion that he is aware of. -hemochromatosis genetic testing on 06/30/22 negative. I reviewed with him  -other labs from 7/21 showed-- ferritin 746, iron 263 with saturation 101%, Hg 9.3, WBC and platelet count normal, folate and B12, methylmalonic acid levels were normal, erythropoietin level elevated at 141.49mIU/ml.  His testosterone level was low, which likely contributed to his anemia. --I discussed the findings thus far with him today.  A bone marrow biopsy for his anemia was recommended, rule out a primary bone marrow disease, and confirm excessive iron deposit in bone marrow.  -He is not a candidate for phlebotomy due to his anemia.  Will likely start iron chelation after next visit, pending bone biopsy results.

## 2023-11-27 ENCOUNTER — Inpatient Hospital Stay: Payer: 59 | Attending: Hematology

## 2023-11-27 ENCOUNTER — Telehealth: Payer: Self-pay

## 2023-11-27 ENCOUNTER — Inpatient Hospital Stay: Payer: 59 | Admitting: Nurse Practitioner

## 2023-11-27 NOTE — Telephone Encounter (Signed)
Called patient due to him not showing up for his appointment today with Vincent Gros NP. Left a message no the patients VM. Will send back to scheduled to get it rescheduled. I no showed the patient today.

## 2023-12-10 ENCOUNTER — Inpatient Hospital Stay: Payer: 59 | Admitting: Nurse Practitioner

## 2023-12-10 ENCOUNTER — Inpatient Hospital Stay: Payer: 59

## 2023-12-10 ENCOUNTER — Telehealth: Payer: Self-pay | Admitting: Hematology

## 2023-12-10 ENCOUNTER — Telehealth: Payer: Self-pay

## 2023-12-10 NOTE — Telephone Encounter (Signed)
Called patient regarding appointments today. Patient had not shown up for 8:00 labs or 8:30 provider visit. Patient notes that he tried to call at 7:30 this morning to let someone know that his transportation had not shown up and that he would like to reschedule appointments.  Message sent to scheduling to get patient rescheduled for a later date. Patient agreeable to plan and confirmed that phone number on file was a good contact number for the patient.

## 2023-12-10 NOTE — Assessment & Plan Note (Deleted)
-  presented with intermittent abdominal pain, abdomen MRI 11/15/21 showed iron deposition in liver, spleen and bone marrow. -he took oral iron for 1 year in 2019 for anemia.  Has never received IV iron or blood transfusion that he is aware of. -hemochromatosis genetic testing on 06/30/22 negative. I reviewed with him  -other labs from 7/21 showed-- ferritin 746, iron 263 with saturation 101%, Hg 9.3, WBC and platelet count normal, folate and B12, methylmalonic acid levels were normal, erythropoietin level elevated at 141.49mIU/ml.  His testosterone level was low, which likely contributed to his anemia. --I discussed the findings thus far with him today.  A bone marrow biopsy for his anemia was recommended, rule out a primary bone marrow disease, and confirm excessive iron deposit in bone marrow.  -He is not a candidate for phlebotomy due to his anemia.  Will likely start iron chelation after next visit, pending bone biopsy results.

## 2023-12-10 NOTE — Progress Notes (Deleted)
Patient Care Team: Ellyn Hack, MD as PCP - General (Family Medicine)  Clinic Day:  12/10/2023  Referring physician: Ellyn Hack, MD  ASSESSMENT & PLAN:   Assessment & Plan: Hemosiderosis -presented with intermittent abdominal pain, abdomen MRI 11/15/21 showed iron deposition in liver, spleen and bone marrow. -he took oral iron for 1 year in 2019 for anemia.  Has never received IV iron or blood transfusion that he is aware of. -hemochromatosis genetic testing on 06/30/22 negative. I reviewed with him  -other labs from 7/21 showed-- ferritin 746, iron 263 with saturation 101%, Hg 9.3, WBC and platelet count normal, folate and B12, methylmalonic acid levels were normal, erythropoietin level elevated at 141.13mIU/ml.  His testosterone level was low, which likely contributed to his anemia. --I discussed the findings thus far with him today.  A bone marrow biopsy for his anemia was recommended, rule out a primary bone marrow disease, and confirm excessive iron deposit in bone marrow.  -He is not a candidate for phlebotomy due to his anemia.  Will likely start iron chelation after next visit, pending bone biopsy results.     The patient understands the plans discussed today and is in agreement with them.  He knows to contact our office if he develops concerns prior to his next appointment.  I provided *** minutes of face-to-face time during this encounter and > 50% was spent counseling as documented under my assessment and plan.    Carlean Jews, NP  [facility] - A DE[facility] [address] [place] [address] Dept: [phone] Dept Fax: [phone]   No orders of the defined types were placed in this encounter.     CHIEF COMPLAINT:  CC: Hemosiderosis  Current Treatment:  pending  INTERVAL HISTORY:  Aarush is here today for repeat clinical assessment.  Was last seen by Dr. Mosetta Putt on  07/14/2022.  Discussed treatment with iron chelation.  A bone marrow biopsy was recommended to rule out causes of anemia.  Thus far, he has not presented for that procedure and has not received treatment for hemosiderosis.  He has history of hepatitis C, untreated.  He denies fevers or chills. He denies pain. His appetite is good. His weight {Weight change:10426}.  I have reviewed the past medical history, past surgical history, social history and family history with the patient and they are unchanged from previous note.  ALLERGIES:  has no known allergies.  MEDICATIONS:  Current Outpatient Medications  Medication Sig Dispense Refill   albuterol (PROVENTIL HFA;VENTOLIN HFA) 108 (90 Base) MCG/ACT inhaler Inhale 1-2 puffs into the lungs every 6 (six) hours as needed for wheezing or shortness of breath. 1 Inhaler 0   Fluticasone-Umeclidin-Vilant 100-62.5-25 MCG/INH AEPB Inhale into the lungs.     naproxen (NAPROSYN) 375 MG tablet Take 1 tablet (375 mg total) by mouth 2 (two) times daily. 30 tablet 0   orphenadrine (NORFLEX) 100 MG tablet Take 1 tablet (100 mg total) by mouth 2 (two) times daily. 30 tablet 0   oxyCODONE (ROXICODONE) 5 MG immediate release tablet Take 1 tablet (5 mg total) by mouth every 4 (four) hours as needed for severe pain. 10 tablet 0   pantoprazole (PROTONIX) 40 MG tablet Take 1 tablet (40 mg total) by mouth 2 (two) times daily. 180 tablet 2   predniSONE (DELTASONE) 50 MG tablet Take 1 tablet (50 mg total) by mouth daily. 5 tablet  0   No current facility-administered medications for this visit.    HISTORY OF PRESENT ILLNESS:   Oncology History   No history exists.      REVIEW OF SYSTEMS:   Constitutional: Denies fevers, chills or abnormal weight loss Eyes: Denies blurriness of vision Ears, nose, mouth, throat, and face: Denies mucositis or sore throat Respiratory: Denies cough, dyspnea or wheezes Cardiovascular: Denies palpitation, chest discomfort or lower extremity  swelling Gastrointestinal:  Denies nausea, heartburn or change in bowel habits Skin: Denies abnormal skin rashes Lymphatics: Denies new lymphadenopathy or easy bruising Neurological:Denies numbness, tingling or new weaknesses Behavioral/Psych: Mood is stable, no new changes  All other systems were reviewed with the patient and are negative.   VITALS:  There were no vitals taken for this visit.  Wt Readings from Last 3 Encounters:  06/30/22 176 lb 6.4 oz (80 kg)  04/14/22 176 lb (79.8 kg)  04/13/22 176 lb 12.8 oz (80.2 kg)    There is no height or weight on file to calculate BMI.  Performance status (ECOG): {CHL ONC [id number]  PHYSICAL EXAM:   GENERAL:alert, no distress and comfortable SKIN: skin color, texture, turgor are normal, no rashes or significant lesions EYES: normal, Conjunctiva are pink and non-injected, sclera clear OROPHARYNX:no exudate, no erythema and lips, buccal mucosa, and tongue normal  NECK: supple, thyroid normal size, non-tender, without nodularity LYMPH:  no palpable lymphadenopathy in the cervical, axillary or inguinal LUNGS: clear to auscultation and percussion with normal breathing effort HEART: regular rate & rhythm and no murmurs and no lower extremity edema ABDOMEN:abdomen soft, non-tender and normal bowel sounds Musculoskeletal:no cyanosis of digits and no clubbing  NEURO: alert & oriented x 3 with fluent speech, no focal motor/sensory deficits  LABORATORY DATA:  I have reviewed the data as listed    Component Value Date/Time   NA 140 10/17/2022 1449   K 4.3 10/17/2022 1449   CL 102 10/17/2022 1449   CO2 29 10/17/2022 1449   GLUCOSE 96 10/17/2022 1449   BUN 10 10/17/2022 1449   CREATININE 0.62 10/17/2022 1449   CALCIUM 9.1 10/17/2022 1449   PROT 6.9 01/09/2018 1130   ALBUMIN 3.8 01/09/2018 1130   AST 22 01/09/2018 1130   ALT 17 01/09/2018 1130   ALKPHOS 73 01/09/2018 1130   BILITOT 1.3 (H) 01/09/2018 1130   GFRNONAA >60 10/17/2022  1449   GFRAA >60 01/09/2018 1130     Lab Results  Component Value Date   WBC 4.2 10/17/2022   NEUTROABS 1.1 (L) 10/17/2022   HGB 9.2 (L) 10/17/2022   HCT 28.5 (L) 10/17/2022   MCV 86.9 10/17/2022   PLT 286 10/17/2022

## 2023-12-17 DIAGNOSIS — D649 Anemia, unspecified: Secondary | ICD-10-CM | POA: Insufficient documentation

## 2023-12-17 NOTE — Assessment & Plan Note (Deleted)
 HFE mutation (-) -presented with intermittent abdominal pain, abdomen MRI 11/15/21 showed iron deposition in liver, spleen and bone marrow. -he took oral iron for 1 year in 2019 for anemia.  Has never received IV iron or blood transfusion that he is aware of. -hemochromatosis genetic testing on 06/30/22 negative. I reviewed with him  -other labs from 7/21 showed-- ferritin 746, iron 263 with saturation 101%, Hg 9.3, WBC and platelet count normal, folate and B12, methylmalonic acid levels were normal, erythropoietin  level elevated at 141.14mIU/ml.  His testosterone  level was low, which likely contributed to his anemia. - I recommend a bone marrow biopsy for his anemia, rule out a primary bone marrow disease, and confirm excessive iron deposit in bone marrow.  -He is not a candidate for phlebotomy due to his anemia.  I will likely start him on iron chelating after next visit.

## 2023-12-17 NOTE — Assessment & Plan Note (Deleted)
-  anemia dating back to at least 2019, took oral iron PRN (based on symptoms) for about a year. -most recent hgb on 06/30/22 was down to 9.3. Vit B12 was WNL, ferritin and iron were elevated.  No lab evidence of nutritional anemia.  Reticulocyte count is very low (<0.4%), no evidence of hemolysis. -He does have low testosterone  level, which contributes to his anemia. -I recommend bone marrow biopsy to rule out other causes for his anemia.

## 2023-12-18 ENCOUNTER — Inpatient Hospital Stay: Payer: 59

## 2023-12-18 ENCOUNTER — Inpatient Hospital Stay: Payer: 59 | Admitting: Hematology

## 2024-01-08 ENCOUNTER — Inpatient Hospital Stay: Payer: 59 | Admitting: Hematology

## 2024-01-08 ENCOUNTER — Inpatient Hospital Stay: Payer: 59

## 2024-01-29 ENCOUNTER — Inpatient Hospital Stay: Payer: 59 | Attending: Hematology

## 2024-01-29 ENCOUNTER — Telehealth: Payer: Self-pay | Admitting: Hematology

## 2024-01-29 ENCOUNTER — Inpatient Hospital Stay (HOSPITAL_BASED_OUTPATIENT_CLINIC_OR_DEPARTMENT_OTHER): Payer: 59 | Admitting: Hematology

## 2024-01-29 VITALS — BP 158/91 | HR 75 | Temp 98.1°F | Resp 16 | Wt 183.2 lb

## 2024-01-29 DIAGNOSIS — J449 Chronic obstructive pulmonary disease, unspecified: Secondary | ICD-10-CM | POA: Diagnosis not present

## 2024-01-29 DIAGNOSIS — K219 Gastro-esophageal reflux disease without esophagitis: Secondary | ICD-10-CM | POA: Insufficient documentation

## 2024-01-29 DIAGNOSIS — D649 Anemia, unspecified: Secondary | ICD-10-CM | POA: Insufficient documentation

## 2024-01-29 LAB — CBC WITH DIFFERENTIAL (CANCER CENTER ONLY)
Abs Immature Granulocytes: 0.02 10*3/uL (ref 0.00–0.07)
Basophils Absolute: 0 10*3/uL (ref 0.0–0.1)
Basophils Relative: 0 %
Eosinophils Absolute: 0.1 10*3/uL (ref 0.0–0.5)
Eosinophils Relative: 2 %
HCT: 30.7 % — ABNORMAL LOW (ref 39.0–52.0)
Hemoglobin: 9.4 g/dL — ABNORMAL LOW (ref 13.0–17.0)
Immature Granulocytes: 0 %
Lymphocytes Relative: 42 %
Lymphs Abs: 2.4 10*3/uL (ref 0.7–4.0)
MCH: 26.6 pg (ref 26.0–34.0)
MCHC: 30.6 g/dL (ref 30.0–36.0)
MCV: 86.7 fL (ref 80.0–100.0)
Monocytes Absolute: 0.9 10*3/uL (ref 0.1–1.0)
Monocytes Relative: 15 %
Neutro Abs: 2.4 10*3/uL (ref 1.7–7.7)
Neutrophils Relative %: 41 %
Platelet Count: 334 10*3/uL (ref 150–400)
RBC: 3.54 MIL/uL — ABNORMAL LOW (ref 4.22–5.81)
RDW: 36.1 % — ABNORMAL HIGH (ref 11.5–15.5)
WBC Count: 5.8 10*3/uL (ref 4.0–10.5)
nRBC: 3.3 % — ABNORMAL HIGH (ref 0.0–0.2)

## 2024-01-29 LAB — IRON AND IRON BINDING CAPACITY (CC-WL,HP ONLY)
Iron: 254 ug/dL — ABNORMAL HIGH (ref 45–182)
Saturation Ratios: 91 % — ABNORMAL HIGH (ref 17.9–39.5)
TIBC: 279 ug/dL (ref 250–450)
UIBC: 25 ug/dL — ABNORMAL LOW (ref 117–376)

## 2024-01-29 LAB — CMP (CANCER CENTER ONLY)
ALT: 18 U/L (ref 0–44)
AST: 20 U/L (ref 15–41)
Albumin: 3.9 g/dL (ref 3.5–5.0)
Alkaline Phosphatase: 66 U/L (ref 38–126)
Anion gap: 4 — ABNORMAL LOW (ref 5–15)
BUN: 11 mg/dL (ref 8–23)
CO2: 33 mmol/L — ABNORMAL HIGH (ref 22–32)
Calcium: 9 mg/dL (ref 8.9–10.3)
Chloride: 104 mmol/L (ref 98–111)
Creatinine: 0.71 mg/dL (ref 0.61–1.24)
GFR, Estimated: >60 mL/min (ref >60)
Glucose, Bld: 88 mg/dL (ref 70–99)
Potassium: 4.4 mmol/L (ref 3.5–5.1)
Sodium: 141 mmol/L (ref 135–145)
Total Bilirubin: 1.4 mg/dL — ABNORMAL HIGH (ref 0.0–1.2)
Total Protein: 6.3 g/dL — ABNORMAL LOW (ref 6.5–8.1)

## 2024-01-29 LAB — FERRITIN: Ferritin: 833 ng/mL — ABNORMAL HIGH (ref 24–336)

## 2024-01-29 NOTE — Progress Notes (Signed)
 [facility]   [phone] [phone]   Clinic Follow up Note   Patient Care Team: Ellyn Hack, MD as PCP - General (Family Medicine)  Date of Service:  01/29/2024  CHIEF COMPLAINT: f/u of anemia and iron overload  CURRENT THERAPY:  Monitoring    Assessment and Plan    Anemia Chronic anemia requiring further diagnostic evaluation. Previous recommendation for bone marrow biopsy not yet completed. Discussed procedure details, including numbing, needle insertion into the pelvic bone, and sample collection. Explained outpatient nature, fasting requirement, and potential discomfort, generally well-tolerated with sedation. Current blood count results pending. 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 Last ferritin level was 746. Bone marrow biopsy needed to confirm diagnosis and determine treatment, as phlebotomy is not an option due to anemia. Discussed medication side effects. - Check current iron levels - Schedule bone marrow biopsy within 2-3 weeks - Follow up 2 weeks after biopsy to discuss results  Chronic Obstructive Pulmonary Disease (COPD) COPD managed with Trelegy inhaler. - Continue Trelegy inhaler  Acid Reflux Chronic acid reflux managed with omeprazole. Reports constipation as a side effect. Discussed over-the-counter options like Miralax. - Continue omeprazole - Recommend over-the-counter Miralax for constipation  General Health Maintenance Not currently taking oxycodone or prednisone. Need to consult primary care physician for pain management and medication review. - Consult primary care physician for pain management and medication review.     Plan -Lab reviewed, anemia stable, no need for blood transfusion -Patient agreed with bone marrow biopsy, will  schedule with IR in the next 2 to 3 weeks -Follow-up 2 weeks after bone marrow biopsy    Discussed the use of AI scribe software for clinical note transcription with the patient, who gave verbal consent to proceed.  History of Present Illness   The patient, a 73 year old male with a history of anemia and iron overload, presents for a follow-up visit. He reports ongoing issues with acid reflux, which he is currently managing with omeprazole. However, he notes that his medications often result in constipation, a problem he has attempted to address with over-the-counter remedies like Miralax, with limited success. He also mentions using an inhaler for COPD.         All other systems were reviewed with the patient and are negative.  MEDICAL HISTORY:  Past Medical History:  Diagnosis Date   Anemia    COPD (chronic obstructive pulmonary disease) (HCC)    GERD (gastroesophageal reflux disease)    Hypertension     SURGICAL HISTORY: Past Surgical History:  Procedure Laterality Date   COLONOSCOPY     MULTIPLE TOOTH EXTRACTIONS      I have reviewed the social history and family history with the patient and they are unchanged from previous note.  ALLERGIES:  has no known allergies.  MEDICATIONS:  Current Outpatient Medications  Medication Sig Dispense Refill   albuterol (PROVENTIL HFA;VENTOLIN HFA) 108 (90 Base) MCG/ACT inhaler Inhale 1-2 puffs into the lungs every 6 (six) hours as needed for wheezing or shortness of breath. 1 Inhaler 0   Fluticasone-Umeclidin-Vilant 100-62.5-25 MCG/INH AEPB Inhale into the lungs.     naproxen (NAPROSYN) 375 MG tablet Take 1 tablet (375 mg total) by mouth 2 (two) times daily. 30 tablet 0   orphenadrine (NORFLEX) 100 MG tablet Take 1 tablet (  100 mg total) by mouth 2 (two) times daily. 30 tablet 0   No current facility-administered medications for this visit.    PHYSICAL EXAMINATION: ECOG PERFORMANCE STATUS: 1 - Symptomatic but completely  ambulatory  Vitals:   01/29/24 0907  BP: (!) 158/91  Pulse: 75  Resp: 16  Temp: 98.1 F (36.7 C)  SpO2: 98%   Wt Readings from Last 3 Encounters:  01/29/24 183 lb 3.2 oz (83.1 kg)  06/30/22 176 lb 6.4 oz (80 kg)  04/14/22 176 lb (79.8 kg)     GENERAL:alert, no distress and comfortable SKIN: skin color, texture, turgor are normal, no rashes or significant lesions EYES: normal, Conjunctiva are pink and non-injected, sclera clear Musculoskeletal:no cyanosis of digits and no clubbing  NEURO: alert & oriented x 3 with fluent speech, no focal motor/sensory deficits    LABORATORY DATA:  I have reviewed the data as listed    Latest Ref Rng & Units 01/29/2024    8:51 AM 10/17/2022    2:49 PM 06/30/2022   12:27 PM  CBC  WBC 4.0 - 10.5 K/uL 5.8  4.2  4.7   Hemoglobin 13.0 - 17.0 g/dL 9.4  9.2  9.3   Hematocrit 39.0 - 52.0 % 30.7  28.5  29.4    28.4   Platelets 150 - 400 K/uL 334  286  383         Latest Ref Rng & Units 01/29/2024    8:51 AM 10/17/2022    2:49 PM 01/09/2018   11:30 AM  CMP  Glucose 70 - 99 mg/dL 88  96  99   BUN 8 - 23 mg/dL 11  10  5    Creatinine 0.61 - 1.24 mg/dL 4.09  8.11  9.14   Sodium 135 - 145 mmol/L 141  140  138   Potassium 3.5 - 5.1 mmol/L 4.4  4.3  4.2   Chloride 98 - 111 mmol/L 104  102  105   CO2 22 - 32 mmol/L 33  29  25   Calcium 8.9 - 10.3 mg/dL 9.0  9.1  8.9   Total Protein 6.5 - 8.1 g/dL 6.3   6.9   Total Bilirubin 0.0 - 1.2 mg/dL 1.4   1.3   Alkaline Phos 38 - 126 U/L 66   73   AST 15 - 41 U/L 20   22   ALT 0 - 44 U/L 18   17       RADIOGRAPHIC STUDIES: I have personally reviewed the radiological images as listed and agreed with the findings in the report. No results found.    Orders Placed This Encounter  Procedures   CT BONE MARROW BIOPSY & ASPIRATION    Standing Status:   Future    Expected Date:   02/12/2024    Expiration Date:   01/28/2025    Reason for Exam (SYMPTOM  OR DIAGNOSIS REQUIRED):   Anemia and iron overload     Preferred location?:   [facility]   All questions were answered. The patient knows to call the clinic with any problems, questions or concerns. No barriers to learning was detected. The total time spent in the appointment was 20 minutes.     Malachy Mood, MD 01/29/2024

## 2024-01-29 NOTE — Telephone Encounter (Signed)
 Patient is aware of scheduled appointment times/dates

## 2024-02-05 ENCOUNTER — Other Ambulatory Visit: Payer: 59

## 2024-02-14 ENCOUNTER — Other Ambulatory Visit: Payer: Self-pay | Admitting: Radiology

## 2024-02-14 DIAGNOSIS — D649 Anemia, unspecified: Secondary | ICD-10-CM

## 2024-02-15 ENCOUNTER — Ambulatory Visit (HOSPITAL_COMMUNITY): Payer: 59

## 2024-02-18 ENCOUNTER — Other Ambulatory Visit: Payer: 59

## 2024-02-21 ENCOUNTER — Inpatient Hospital Stay: Admission: RE | Admit: 2024-02-21 | Payer: 59 | Source: Ambulatory Visit

## 2024-02-25 ENCOUNTER — Inpatient Hospital Stay: Payer: 59 | Attending: Hematology | Admitting: Hematology

## 2024-02-25 ENCOUNTER — Telehealth: Payer: Self-pay

## 2024-02-25 NOTE — Telephone Encounter (Signed)
 Patient has not arrived to his 1100 appointment with Dr. Mosetta Putt. Attempted to contact patient, via telephone call. Unable to reach the patient. Unable to LVM on unidentifiable vmail.

## 2024-02-27 ENCOUNTER — Other Ambulatory Visit

## 2024-03-03 ENCOUNTER — Other Ambulatory Visit

## 2024-03-05 ENCOUNTER — Inpatient Hospital Stay: Admitting: Hematology

## 2024-03-17 ENCOUNTER — Ambulatory Visit (HOSPITAL_COMMUNITY)

## 2024-04-11 ENCOUNTER — Other Ambulatory Visit: Payer: Self-pay | Admitting: Student

## 2024-04-11 ENCOUNTER — Other Ambulatory Visit: Payer: Self-pay | Admitting: Radiology

## 2024-04-14 ENCOUNTER — Ambulatory Visit (HOSPITAL_COMMUNITY)

## 2024-04-14 ENCOUNTER — Ambulatory Visit (HOSPITAL_COMMUNITY)
Admission: RE | Admit: 2024-04-14 | Discharge: 2024-04-14 | Disposition: A | Discharge status: Home / Self Care | Source: Ambulatory Visit | Attending: Hematology | Admitting: Hematology

## 2024-04-15 ENCOUNTER — Observation Stay (HOSPITAL_COMMUNITY)

## 2024-04-15 ENCOUNTER — Emergency Department (HOSPITAL_COMMUNITY)

## 2024-04-15 ENCOUNTER — Inpatient Hospital Stay (HOSPITAL_COMMUNITY)
Admission: EM | Admit: 2024-04-15 | Discharge: 2024-04-17 | DRG: 309 | Disposition: A | Discharge status: Home / Self Care | Attending: Student | Admitting: Student

## 2024-04-15 ENCOUNTER — Other Ambulatory Visit: Payer: Self-pay

## 2024-04-15 ENCOUNTER — Encounter (HOSPITAL_COMMUNITY): Payer: Self-pay | Admitting: Internal Medicine

## 2024-04-15 DIAGNOSIS — Z8719 Personal history of other diseases of the digestive system: Secondary | ICD-10-CM

## 2024-04-15 DIAGNOSIS — Z1152 Encounter for screening for COVID-19: Secondary | ICD-10-CM

## 2024-04-15 DIAGNOSIS — R1013 Epigastric pain: Secondary | ICD-10-CM

## 2024-04-15 DIAGNOSIS — I959 Hypotension, unspecified: Secondary | ICD-10-CM | POA: Diagnosis present

## 2024-04-15 DIAGNOSIS — I110 Hypertensive heart disease with heart failure: Secondary | ICD-10-CM | POA: Diagnosis present

## 2024-04-15 DIAGNOSIS — I5032 Chronic diastolic (congestive) heart failure: Secondary | ICD-10-CM | POA: Diagnosis present

## 2024-04-15 DIAGNOSIS — Z7901 Long term (current) use of anticoagulants: Secondary | ICD-10-CM

## 2024-04-15 DIAGNOSIS — I4891 Unspecified atrial fibrillation: Principal | ICD-10-CM | POA: Diagnosis present

## 2024-04-15 DIAGNOSIS — K2970 Gastritis, unspecified, without bleeding: Secondary | ICD-10-CM | POA: Insufficient documentation

## 2024-04-15 DIAGNOSIS — Z79899 Other long term (current) drug therapy: Secondary | ICD-10-CM

## 2024-04-15 DIAGNOSIS — F17210 Nicotine dependence, cigarettes, uncomplicated: Secondary | ICD-10-CM | POA: Diagnosis present

## 2024-04-15 DIAGNOSIS — D649 Anemia, unspecified: Secondary | ICD-10-CM | POA: Diagnosis present

## 2024-04-15 DIAGNOSIS — K219 Gastro-esophageal reflux disease without esophagitis: Secondary | ICD-10-CM | POA: Diagnosis present

## 2024-04-15 DIAGNOSIS — Z716 Tobacco abuse counseling: Secondary | ICD-10-CM

## 2024-04-15 DIAGNOSIS — Z7951 Long term (current) use of inhaled steroids: Secondary | ICD-10-CM

## 2024-04-15 DIAGNOSIS — E872 Acidosis, unspecified: Secondary | ICD-10-CM | POA: Diagnosis present

## 2024-04-15 DIAGNOSIS — R0603 Acute respiratory distress: Secondary | ICD-10-CM | POA: Diagnosis not present

## 2024-04-15 DIAGNOSIS — J9811 Atelectasis: Secondary | ICD-10-CM | POA: Diagnosis present

## 2024-04-15 DIAGNOSIS — J189 Pneumonia, unspecified organism: Secondary | ICD-10-CM | POA: Insufficient documentation

## 2024-04-15 DIAGNOSIS — G8929 Other chronic pain: Secondary | ICD-10-CM | POA: Diagnosis present

## 2024-04-15 DIAGNOSIS — Z8249 Family history of ischemic heart disease and other diseases of the circulatory system: Secondary | ICD-10-CM

## 2024-04-15 DIAGNOSIS — E291 Testicular hypofunction: Secondary | ICD-10-CM | POA: Diagnosis present

## 2024-04-15 DIAGNOSIS — Z800 Family history of malignant neoplasm of digestive organs: Secondary | ICD-10-CM

## 2024-04-15 DIAGNOSIS — R1010 Upper abdominal pain, unspecified: Secondary | ICD-10-CM | POA: Diagnosis present

## 2024-04-15 DIAGNOSIS — J449 Chronic obstructive pulmonary disease, unspecified: Secondary | ICD-10-CM | POA: Insufficient documentation

## 2024-04-15 DIAGNOSIS — R739 Hyperglycemia, unspecified: Secondary | ICD-10-CM | POA: Insufficient documentation

## 2024-04-15 DIAGNOSIS — N433 Hydrocele, unspecified: Secondary | ICD-10-CM | POA: Diagnosis present

## 2024-04-15 LAB — CBC WITH DIFFERENTIAL/PLATELET
Abs Immature Granulocytes: 0.02 10*3/uL (ref 0.00–0.07)
Basophils Absolute: 0 10*3/uL (ref 0.0–0.1)
Basophils Relative: 0 %
Eosinophils Absolute: 0.1 10*3/uL (ref 0.0–0.5)
Eosinophils Relative: 1 %
HCT: 29 % — ABNORMAL LOW (ref 39.0–52.0)
Hemoglobin: 9.3 g/dL — ABNORMAL LOW (ref 13.0–17.0)
Immature Granulocytes: 0 %
Lymphocytes Relative: 60 %
Lymphs Abs: 5.2 10*3/uL — ABNORMAL HIGH (ref 0.7–4.0)
MCH: 27.7 pg (ref 26.0–34.0)
MCHC: 32.1 g/dL (ref 30.0–36.0)
MCV: 86.3 fL (ref 80.0–100.0)
Monocytes Absolute: 1.3 10*3/uL — ABNORMAL HIGH (ref 0.1–1.0)
Monocytes Relative: 15 %
Neutro Abs: 2.1 10*3/uL (ref 1.7–7.7)
Neutrophils Relative %: 24 %
Platelets: 388 10*3/uL (ref 150–400)
RBC: 3.36 MIL/uL — ABNORMAL LOW (ref 4.22–5.81)
RDW: 37.2 % — ABNORMAL HIGH (ref 11.5–15.5)
WBC: 8.7 10*3/uL (ref 4.0–10.5)
nRBC: 0.3 % — ABNORMAL HIGH (ref 0.0–0.2)

## 2024-04-15 LAB — I-STAT CHEM 8, ED
BUN: 12 mg/dL (ref 8–23)
Calcium, Ion: 1.07 mmol/L — ABNORMAL LOW (ref 1.15–1.40)
Chloride: 98 mmol/L (ref 98–111)
Creatinine, Ser: 0.8 mg/dL (ref 0.61–1.24)
Glucose, Bld: 161 mg/dL — ABNORMAL HIGH (ref 70–99)
HCT: 35 % — ABNORMAL LOW (ref 39.0–52.0)
Hemoglobin: 11.9 g/dL — ABNORMAL LOW (ref 13.0–17.0)
Potassium: 4 mmol/L (ref 3.5–5.1)
Sodium: 139 mmol/L (ref 135–145)
TCO2: 26 mmol/L (ref 22–32)

## 2024-04-15 LAB — RESP PANEL BY RT-PCR (RSV, FLU A&B, COVID)  RVPGX2
Influenza A by PCR: NEGATIVE
Influenza B by PCR: NEGATIVE
Resp Syncytial Virus by PCR: NEGATIVE
SARS Coronavirus 2 by RT PCR: NEGATIVE

## 2024-04-15 LAB — LIPASE, BLOOD: Lipase: 25 U/L (ref 11–51)

## 2024-04-15 LAB — COMPREHENSIVE METABOLIC PANEL WITH GFR
ALT: 22 U/L (ref 0–44)
AST: 27 U/L (ref 15–41)
Albumin: 3.5 g/dL (ref 3.5–5.0)
Alkaline Phosphatase: 59 U/L (ref 38–126)
Anion gap: 10 (ref 5–15)
BUN: 12 mg/dL (ref 8–23)
CO2: 27 mmol/L (ref 22–32)
Calcium: 8.9 mg/dL (ref 8.9–10.3)
Chloride: 101 mmol/L (ref 98–111)
Creatinine, Ser: 0.9 mg/dL (ref 0.61–1.24)
GFR, Estimated: >60 mL/min (ref >60)
Glucose, Bld: 157 mg/dL — ABNORMAL HIGH (ref 70–99)
Potassium: 4 mmol/L (ref 3.5–5.1)
Sodium: 138 mmol/L (ref 135–145)
Total Bilirubin: 1.5 mg/dL — ABNORMAL HIGH (ref 0.0–1.2)
Total Protein: 6.4 g/dL — ABNORMAL LOW (ref 6.5–8.1)

## 2024-04-15 LAB — I-STAT CG4 LACTIC ACID, ED
Lactic Acid, Venous: 2.4 mmol/L (ref 0.5–1.9)
Lactic Acid, Venous: 2.6 mmol/L (ref 0.5–1.9)

## 2024-04-15 LAB — TROPONIN I (HIGH SENSITIVITY)
Troponin I (High Sensitivity): 5 ng/L (ref <18)
Troponin I (High Sensitivity): 5 ng/L (ref <18)

## 2024-04-15 LAB — BRAIN NATRIURETIC PEPTIDE: B Natriuretic Peptide: 203.1 pg/mL — ABNORMAL HIGH (ref 0.0–100.0)

## 2024-04-15 LAB — MAGNESIUM: Magnesium: 1.5 mg/dL — ABNORMAL LOW (ref 1.7–2.4)

## 2024-04-15 MED ORDER — MAGNESIUM SULFATE 2 GM/50ML IV SOLN
2.0000 g | Freq: Once | INTRAVENOUS | Status: AC
  Administered 2024-04-15: 2 g via INTRAVENOUS
  Filled 2024-04-15: qty 50

## 2024-04-15 MED ORDER — SODIUM CHLORIDE 0.9 % IV SOLN
2.0000 g | Freq: Once | INTRAVENOUS | Status: AC
  Administered 2024-04-15: 2 g via INTRAVENOUS
  Filled 2024-04-15: qty 20

## 2024-04-15 MED ORDER — AMIODARONE LOAD VIA INFUSION
150.0000 mg | Freq: Once | INTRAVENOUS | Status: AC
  Administered 2024-04-15: 150 mg via INTRAVENOUS
  Filled 2024-04-15: qty 83.34

## 2024-04-15 MED ORDER — SODIUM CHLORIDE 0.9 % IV SOLN
100.0000 mg | Freq: Once | INTRAVENOUS | Status: AC
  Administered 2024-04-15: 100 mg via INTRAVENOUS
  Filled 2024-04-15: qty 100

## 2024-04-15 MED ORDER — SODIUM CHLORIDE 0.9 % IV BOLUS
1000.0000 mL | Freq: Once | INTRAVENOUS | Status: AC
  Administered 2024-04-15: 1000 mL via INTRAVENOUS

## 2024-04-15 MED ORDER — SODIUM CHLORIDE 0.9 % IV SOLN
100.0000 mg | Freq: Two times a day (BID) | INTRAVENOUS | Status: DC
  Administered 2024-04-16: 100 mg via INTRAVENOUS
  Filled 2024-04-15: qty 100

## 2024-04-15 MED ORDER — SODIUM CHLORIDE 0.9 % IV SOLN: 2.0000 g | INTRAVENOUS | Status: DC

## 2024-04-15 MED ORDER — AMIODARONE HCL IN DEXTROSE 360-4.14 MG/200ML-% IV SOLN
30.0000 mg/h | INTRAVENOUS | Status: DC
  Administered 2024-04-16 – 2024-04-17 (×4): 30 mg/h via INTRAVENOUS
  Filled 2024-04-15 (×3): qty 200

## 2024-04-15 MED ORDER — AMIODARONE HCL IN DEXTROSE 360-4.14 MG/200ML-% IV SOLN
60.0000 mg/h | INTRAVENOUS | Status: DC
  Administered 2024-04-15 (×2): 60 mg/h via INTRAVENOUS
  Filled 2024-04-15 (×2): qty 200

## 2024-04-15 MED ORDER — HEPARIN (PORCINE) 25000 UT/250ML-% IV SOLN
1300.0000 [IU]/h | INTRAVENOUS | Status: DC
  Administered 2024-04-15: 1150 [IU]/h via INTRAVENOUS
  Filled 2024-04-15: qty 250

## 2024-04-15 MED ORDER — HEPARIN BOLUS VIA INFUSION
4000.0000 [IU] | Freq: Once | INTRAVENOUS | Status: AC
  Administered 2024-04-15: 4000 [IU] via INTRAVENOUS
  Filled 2024-04-15: qty 4000

## 2024-04-15 NOTE — Progress Notes (Signed)
 ABG not obtained at this time per MD [facility].

## 2024-04-15 NOTE — ED Notes (Signed)
 XR at bedside

## 2024-04-15 NOTE — Progress Notes (Signed)
 PHARMACY - ANTICOAGULATION CONSULT NOTE  Pharmacy Consult for heparin  Indication: atrial fibrillation  No Known Allergies  Patient Measurements: Height: 5\' 6"  (167.6 cm) Weight: 83 kg (182 lb 15.7 oz) IBW/kg (Calculated) : 63.8 HEPARIN DW (KG): 80.7  Vital Signs: Temp: 98.1 F (36.7 C) (05/06 1824) Temp Source: Oral (05/06 1824) BP: 111/90 (05/06 1900) Pulse Rate: 143 (05/06 1900)  Labs: Recent Labs    04/15/24 1818 04/15/24 1839  HGB  --  11.9*  HCT  --  35.0*  CREATININE 0.90 0.80  TROPONINIHS 5  --     Estimated Creatinine Clearance: 85.7 mL/min (by C-G formula based on SCr of 0.8 mg/dL).   Medical History: Past Medical History:  Diagnosis Date   Anemia    COPD (chronic obstructive pulmonary disease) (HCC)    GERD (gastroesophageal reflux disease)    Hypertension     Assessment: Patient admitted with CC of respiratory distress and shortness of breath. Found to be in Afib, not on anticoagulation PTA. CBC in process but last in 01/2024 was within normal limits. Pharmacy consulted to dose heparin.   Goal of Therapy:  Heparin level 0.3-0.7 units/ml Monitor platelets by anticoagulation protocol: Yes   Plan:  Give 4000 units bolus x 1 Start heparin infusion at 1150 units/hr Check anti-Xa level in 8 hours and daily while on heparin Continue to monitor H&H and platelets F/u CBC   Mamie Searles, PharmD, BCCCP  04/15/2024,7:33 PM

## 2024-04-15 NOTE — Consult Note (Signed)
 CONSULTATION NOTE   Patient Name: Vincent Marquez Date of Encounter: 04/15/2024 Cardiologist: None Electrophysiologist: None Advanced Heart Failure: None   Chief Complaint   Short of breath  Patient Profile   73 year old male with history of COPD presents with tachycardia and progressive dyspnea on exertion, found to be in A-fib with RVR and hypotensive  HPI   Vincent Marquez is a 73 y.o. male who is being seen today for the evaluation of A-fib with RVR at the request of Dr. Aldean Amass.  Is a 73 year old male with a history of COPD, anemia, hypertension and GERD, who has been treated for iron overload requiring phlebotomy.  He presented today after several days of progressive shortness of breath, abdominal swelling and fatigue.  He was found to be tachycardic in atrial fibrillation with rapid ventricular response and heart rates near 200.  Initially was hypotensive in the ER placed him on amiodarone.  Heart rate has improved down into about the 130s.  When I examined him he still appeared dyspneic.  He says that his sister died a few days ago of heart failure.  He reports has been having some abdominal discomfort and symptoms are similar to that when he gets reflux.  He was initially noted to be wheezing and was placed on CPAP.  Initial labs noted an elevated lactate of 2.4 with a repeat of 2.6.  BNP 203 with troponin negative x 2 at 5, 5.  Creatinine 0.8 with magnesium low at 1.5.  Chest x-ray shows new left basilar atelectasis versus infiltrate.  PMHx   Past Medical History:  Diagnosis Date   Anemia    COPD (chronic obstructive pulmonary disease) (HCC)    GERD (gastroesophageal reflux disease)    Hypertension     Past Surgical History:  Procedure Laterality Date   COLONOSCOPY     MULTIPLE TOOTH EXTRACTIONS      FAMHx   Family History  Problem Relation Age of Onset   Cancer Mother    Cancer Sister        gastric cancer   Other Sister        Unknown stomach issues   Cancer  Sister        gastric cancer   Other Sister        Stomach issues, possibly cancer   Colon cancer Neg Hx    Esophageal cancer Neg Hx    Rectal cancer Neg Hx    Stomach cancer Neg Hx     SOCHx    reports that he has been smoking cigarettes. He has a 50 pack-year smoking history. He has never used smokeless tobacco. He reports current alcohol use. He reports that he does not use drugs.  Outpatient Medications   No current facility-administered medications on file prior to encounter.   Current Outpatient Medications on File Prior to Encounter  Medication Sig Dispense Refill   albuterol  (PROVENTIL  HFA;VENTOLIN  HFA) 108 (90 Base) MCG/ACT inhaler Inhale 1-2 puffs into the lungs every 6 (six) hours as needed for wheezing or shortness of breath. 1 Inhaler 0   Fluticasone-Umeclidin-Vilant 100-62.5-25 MCG/INH AEPB Inhale into the lungs.     omeprazole (PRILOSEC) 20 MG capsule Take 20 mg by mouth daily.     sildenafil (VIAGRA) 50 MG tablet Take 50 mg by mouth daily as needed for erectile dysfunction.     naproxen  (NAPROSYN ) 375 MG tablet Take 1 tablet (375 mg total) by mouth 2 (two) times daily. (Patient not taking: Reported on 04/15/2024) 30  tablet 0   orphenadrine  (NORFLEX ) 100 MG tablet Take 1 tablet (100 mg total) by mouth 2 (two) times daily. (Patient not taking: Reported on 04/15/2024) 30 tablet 0    Inpatient Medications    Scheduled Meds:   Continuous Infusions:  amiodarone 60 mg/hr (04/15/24 1842)   Followed by   Cecily Cohen ON 04/16/2024] amiodarone     doxycycline  (VIBRAMYCIN ) IV 100 mg (04/15/24 2031)   heparin 1,150 Units/hr (04/15/24 2008)    PRN Meds:    ALLERGIES   No Known Allergies  ROS   Pertinent items noted in HPI and remainder of comprehensive ROS otherwise negative.  Vitals   Vitals:   04/15/24 1827 04/15/24 1830 04/15/24 1845 04/15/24 1900  BP: 96/79 (!) 100/57 98/68 (!) 111/90  Pulse: (!) 144 77 83 (!) 143  Resp: (!) 29 (!) 22 (!) 21 (!) 28  Temp:       TempSrc:      SpO2:  93% 95% 93%  Weight:      Height:       No intake or output data in the 24 hours ending 04/15/24 2052 Filed Weights   04/15/24 1816  Weight: 83 kg    Physical Exam   General appearance: alert and mild distress Neck: no carotid bruit, no JVD, and thyroid not enlarged, symmetric, no tenderness/mass/nodules Lungs: diminished breath sounds bibasilar Heart: irregularly irregular rhythm and tachycardic Abdomen: Protuberant, firm, no rebound or guarding Extremities: edema trace right lower extremity Pulses: 2+ and symmetric Skin: Skin color, texture, turgor normal. No rashes or lesions Neurologic: Grossly normal Psych: Anxious  Labs   Results for orders placed or performed during the hospital encounter of 04/15/24 (from the past 48 hours)  CBC with Differential     Status: Abnormal   Collection Time: 04/15/24  6:18 PM  Result Value Ref Range   WBC 8.7 4.0 - 10.5 K/uL   RBC 3.36 (L) 4.22 - 5.81 MIL/uL   Hemoglobin 9.3 (L) 13.0 - 17.0 g/dL   HCT 47.8 (L) 29.5 - 62.1 %   MCV 86.3 80.0 - 100.0 fL   MCH 27.7 26.0 - 34.0 pg   MCHC 32.1 30.0 - 36.0 g/dL   RDW 30.8 (H) 65.7 - 84.6 %   Platelets 388 150 - 400 K/uL   nRBC 0.3 (H) 0.0 - 0.2 %   Neutrophils Relative % 24 %   Neutro Abs 2.1 1.7 - 7.7 K/uL   Lymphocytes Relative 60 %   Lymphs Abs 5.2 (H) 0.7 - 4.0 K/uL   Monocytes Relative 15 %   Monocytes Absolute 1.3 (H) 0.1 - 1.0 K/uL   Eosinophils Relative 1 %   Eosinophils Absolute 0.1 0.0 - 0.5 K/uL   Basophils Relative 0 %   Basophils Absolute 0.0 0.0 - 0.1 K/uL   WBC Morphology MORPHOLOGY UNREMARKABLE    RBC Morphology See Note    Smear Review PLATELET COUNT CONFIRMED BY SMEAR    Immature Granulocytes 0 %   Abs Immature Granulocytes 0.02 0.00 - 0.07 K/uL   Target Cells PRESENT     Comment: Performed at [facility], [address], [place], [place] [address]  Troponin I (High Sensitivity)     Status: None   Collection Time: 04/15/24  6:18 PM   Result Value Ref Range   Troponin I (High Sensitivity) 5 <18 ng/L    Comment: (NOTE) Elevated high sensitivity troponin I (hsTnI) values and significant  changes across serial measurements may suggest ACS but  many other  chronic and acute conditions are known to elevate hsTnI results.  Refer to the "Links" section for chest pain algorithms and additional  guidance. Performed at [facility], [address], [place], [place] [address]   Comprehensive metabolic panel     Status: Abnormal   Collection Time: 04/15/24  6:18 PM  Result Value Ref Range   Sodium 138 135 - 145 mmol/L   Potassium 4.0 3.5 - 5.1 mmol/L   Chloride 101 98 - 111 mmol/L   CO2 27 22 - 32 mmol/L   Glucose, Bld 157 (H) 70 - 99 mg/dL    Comment: Glucose reference range applies only to samples taken after fasting for at least 8 hours.   BUN 12 8 - 23 mg/dL   Creatinine, Ser 4.03 0.61 - 1.24 mg/dL   Calcium 8.9 8.9 - 47.4 mg/dL   Total Protein 6.4 (L) 6.5 - 8.1 g/dL   Albumin 3.5 3.5 - 5.0 g/dL   AST 27 15 - 41 U/L   ALT 22 0 - 44 U/L   Alkaline Phosphatase 59 38 - 126 U/L   Total Bilirubin 1.5 (H) 0.0 - 1.2 mg/dL   GFR, Estimated >25 >95 mL/min    Comment: (NOTE) Calculated using the CKD-EPI Creatinine Equation (2021)    Anion gap 10 5 - 15    Comment: Performed at [facility], [address] [place], [place] [address]  Brain natriuretic peptide     Status: Abnormal   Collection Time: 04/15/24  6:18 PM  Result Value Ref Range   B Natriuretic Peptide 203.1 (H) 0.0 - 100.0 pg/mL    Comment: Performed at [facility], [address] [place], [place] [address]  Magnesium     Status: Abnormal   Collection Time: 04/15/24  6:18 PM  Result Value Ref Range   Magnesium 1.5 (L) 1.7 - 2.4 mg/dL    Comment: Performed at [facility], [address], [place], [place] [address]  Lipase, blood     Status: None   Collection Time: 04/15/24  6:18 PM  Result Value Ref Range   Lipase 25 11 - 51 U/L     Comment: Performed at [facility], [address], [place], [place] [address]  I-stat chem 8, ED     Status: Abnormal   Collection Time: 04/15/24  6:39 PM  Result Value Ref Range   Sodium 139 135 - 145 mmol/L   Potassium 4.0 3.5 - 5.1 mmol/L   Chloride 98 98 - 111 mmol/L   BUN 12 8 - 23 mg/dL   Creatinine, Ser 6.30 0.61 - 1.24 mg/dL   Glucose, Bld 160 (H) 70 - 99 mg/dL    Comment: Glucose reference range applies only to samples taken after fasting for at least 8 hours.   Calcium, Ion 1.07 (L) 1.15 - 1.40 mmol/L   TCO2 26 22 - 32 mmol/L   Hemoglobin 11.9 (L) 13.0 - 17.0 g/dL   HCT 10.9 (L) 32.3 - 55.7 %  I-Stat Lactic Acid     Status: Abnormal   Collection Time: 04/15/24  6:40 PM  Result Value Ref Range   Lactic Acid, Venous 2.4 (HH) 0.5 - 1.9 mmol/L   Comment NOTIFIED PHYSICIAN   Troponin I (High Sensitivity)     Status: None   Collection Time: 04/15/24  7:59 PM  Result Value Ref Range   Troponin I (High Sensitivity) 5 <18 ng/L  Comment: (NOTE) Elevated high sensitivity troponin I (hsTnI) values and significant  changes across serial measurements may suggest ACS but many other  chronic and acute conditions are known to elevate hsTnI results.  Refer to the "Links" section for chest pain algorithms and additional  guidance. Performed at [facility], [address] [place], [place] [address]   I-Stat Lactic Acid     Status: Abnormal   Collection Time: 04/15/24  8:05 PM  Result Value Ref Range   Lactic Acid, Venous 2.6 (HH) 0.5 - 1.9 mmol/L   Comment NOTIFIED PHYSICIAN     ECG   A-fib with rapid ventricular response at 197- Personally Reviewed  Telemetry   A-fib with rapid ventricular response- Personally Reviewed  Radiology   DG Chest Portable 1 View Result Date: 04/15/2024 CLINICAL DATA:  Respiratory distress. EXAM: PORTABLE CHEST 1 VIEW COMPARISON:  Radiographs 01/15/2019 and 03/27/2013.  CT 10/01/2023. FINDINGS: 1824 hours. The heart size and  mediastinal contours are stable. Lower lung volumes with new patchy left basilar airspace disease. The right lung appears clear. No evidence of pneumothorax or significant pleural effusion. The bones appear unremarkable. IMPRESSION: New left basilar atelectasis or early infiltrate.  No edema. Electronically Signed   By: Elmon Hagedorn M.D.   On: 04/15/2024 18:42    Cardiac Studies   N/A  Impression   Active Problems:   Atrial fibrillation with rapid ventricular response [facility])   Recommendation   Mr. Kellough has new onset atrial fibrillation with rapid ventricular response.  Although his symptoms worsen today, it is not clear that it started within the past 48 hours.  He does appear dyspneic although BNP only mildly elevated.  Would have concern for clinical heart failure.  He was started on amiodarone with some improvement in rates.  I would also anticoagulate him with heparin as he may need further workup while hospitalized.  Troponins fortunately are negative.  Will plan an echocardiogram tomorrow.  Cardiology will continue to follow with you.  Thanks for the consultation.  Time Spent Directly with Patient:  I have spent a total of 65 minutes with the patient reviewing hospital notes, telemetry, EKGs, labs and examining the patient as well as establishing an assessment and plan that was discussed personally with the patient.  > 50% of time was spent in direct patient care.  Length of Stay:  LOS: 0 days   Hazle Lites, MD, [facility], FNLA, FACP  [place]  [facility]  Medical Director of the Advanced Lipid Disorders &  Cardiovascular Risk Reduction Clinic Diplomate of the American Board of Clinical Lipidology Attending Cardiologist  Direct Dial: [phone]  Fax: [phone]  Website:  [email or web address] 04/15/2024, 8:52 PM

## 2024-04-15 NOTE — ED Provider Notes (Signed)
 [place] [facility] DEPARTMENT AT [facility] Provider Note   CSN: [id number] Arrival date & time: 04/15/24  1756     History  Chief Complaint  Patient presents with   Respiratory Distress    Vincent Marquez is a 73 y.o. male.  HPI 73 year old male presents with shortness of breath.  Started just a couple hours ago.  Patient states that previous to this he was feeling fine.  However since this started he has had a cough, feels hot like he has a fever and dizzy.  He denies any palpitations or chest pain.  He is having abdominal pain which he states he gets from reflux and this feels similar.  EMS reported wheezing and gave him albuterol  and put him on CPAP. He feels like he's gotten better since then, but feels like he's getting worse while on the BiPAP he was placed on prior to my arrival in the room.  Home Medications Prior to Admission medications   Medication Sig Start Date End Date Taking? Authorizing Provider  albuterol  (PROVENTIL  HFA;VENTOLIN  HFA) 108 (90 Base) MCG/ACT inhaler Inhale 1-2 puffs into the lungs every 6 (six) hours as needed for wheezing or shortness of breath. 01/15/19  Yes Ethlyn Herd, MD  Fluticasone-Umeclidin-Vilant 100-62.5-25 MCG/INH AEPB Inhale into the lungs.   Yes [provider]  omeprazole (PRILOSEC) 20 MG capsule Take 20 mg by mouth daily.   Yes [provider]  sildenafil (VIAGRA) 50 MG tablet Take 50 mg by mouth daily as needed for erectile dysfunction.   Yes [provider]  naproxen  (NAPROSYN ) 375 MG tablet Take 1 tablet (375 mg total) by mouth 2 (two) times daily. Patient not taking: Reported on 04/15/2024 10/18/22   Alissa April, MD  orphenadrine  (NORFLEX ) 100 MG tablet Take 1 tablet (100 mg total) by mouth 2 (two) times daily. Patient not taking: Reported on 04/15/2024 10/18/22   Alissa April, MD      Allergies    Patient has no known allergies.    Review of Systems   Review of Systems  Constitutional:   Positive for fever.  Respiratory:  Positive for cough and shortness of breath.   Cardiovascular:  Negative for chest pain and leg swelling.  Gastrointestinal:  Positive for abdominal pain.  Neurological:  Positive for dizziness.    Physical Exam Updated Vital Signs BP 104/68   Pulse (!) 143   Temp 98.1 F (36.7 C) (Oral)   Resp 14   Ht 5\' 6"  (1.676 m)   Wt 83 kg   SpO2 93%   BMI 29.53 kg/m  Physical Exam Vitals and nursing note reviewed.  Constitutional:      Appearance: He is well-developed.  HENT:     Head: Normocephalic and atraumatic.  Cardiovascular:     Rate and Rhythm: Tachycardia present. Rhythm irregular.     Heart sounds: Normal heart sounds.  Pulmonary:     Effort: Tachypnea and accessory muscle usage present.     Breath sounds: Examination of the right-lower field reveals decreased breath sounds. Examination of the left-lower field reveals decreased breath sounds. Decreased breath sounds present. No wheezing.  Abdominal:     Palpations: Abdomen is soft.     Tenderness: There is abdominal tenderness.  Skin:    General: Skin is warm and dry.  Neurological:     Mental Status: He is alert.     ED Results / Procedures / Treatments   Labs (all labs ordered are listed, but only abnormal results  are displayed) Labs Reviewed  CBC WITH DIFFERENTIAL/PLATELET - Abnormal; Notable for the following components:      Result Value   RBC 3.36 (*)    Hemoglobin 9.3 (*)    HCT 29.0 (*)    RDW 37.2 (*)    nRBC 0.3 (*)    Lymphs Abs 5.2 (*)    Monocytes Absolute 1.3 (*)    All other components within normal limits  COMPREHENSIVE METABOLIC PANEL WITH GFR - Abnormal; Notable for the following components:   Glucose, Bld 157 (*)    Total Protein 6.4 (*)    Total Bilirubin 1.5 (*)    All other components within normal limits  BRAIN NATRIURETIC PEPTIDE - Abnormal; Notable for the following components:   B Natriuretic Peptide 203.1 (*)    All other components within  normal limits  MAGNESIUM - Abnormal; Notable for the following components:   Magnesium 1.5 (*)    All other components within normal limits  I-STAT CHEM 8, ED - Abnormal; Notable for the following components:   Glucose, Bld 161 (*)    Calcium, Ion 1.07 (*)    Hemoglobin 11.9 (*)    HCT 35.0 (*)    All other components within normal limits  I-STAT CG4 LACTIC ACID, ED - Abnormal; Notable for the following components:   Lactic Acid, Venous 2.4 (*)    All other components within normal limits  I-STAT CG4 LACTIC ACID, ED - Abnormal; Notable for the following components:   Lactic Acid, Venous 2.6 (*)    All other components within normal limits  RESP PANEL BY RT-PCR (RSV, FLU A&B, COVID)  [id number]  CULTURE, BLOOD (ROUTINE X 2)  CULTURE, BLOOD (ROUTINE X 2)  LIPASE, BLOOD  HEPARIN LEVEL (UNFRACTIONATED)  CBC  I-STAT ARTERIAL BLOOD GAS, ED  TROPONIN I (HIGH SENSITIVITY)  TROPONIN I (HIGH SENSITIVITY)    EKG EKG Interpretation Date/Time:  Tuesday Apr 15 2024 18:08:25 EDT Ventricular Rate:  197 PR Interval:    QRS Duration:  89 QT Interval:  286 QTC Calculation: 541 R Axis:   95  Text Interpretation: Atrial fibrillation with rapid V-rate Paired ventricular premature complexes Right axis deviation Probable anterolateral infarct, old Baseline wander in lead(s) V1 No old tracing to compare Confirmed by Jerilynn Montenegro [phone]) on 04/15/2024 6:41:31 PM  Radiology DG Chest Portable 1 View Result Date: 04/15/2024 CLINICAL DATA:  Respiratory distress. EXAM: PORTABLE CHEST 1 VIEW COMPARISON:  Radiographs 01/15/2019 and 03/27/2013.  CT 10/01/2023. FINDINGS: 1824 hours. The heart size and mediastinal contours are stable. Lower lung volumes with new patchy left basilar airspace disease. The right lung appears clear. No evidence of pneumothorax or significant pleural effusion. The bones appear unremarkable. IMPRESSION: New left basilar atelectasis or early infiltrate.  No edema. Electronically Signed    By: Elmon Hagedorn M.D.   On: 04/15/2024 18:42    Procedures .Critical Care  Performed by: Jerilynn Montenegro, MD Authorized by: Jerilynn Montenegro, MD   Critical care provider statement:    Critical care time (minutes):  40   Critical care time was exclusive of:  Separately billable procedures and treating other patients   Critical care was necessary to treat or prevent imminent or life-threatening deterioration of the following conditions:  Cardiac failure, circulatory failure and respiratory failure   Critical care was time spent personally by me on the following activities:  Development of treatment plan with patient or surrogate, discussions with consultants, evaluation of patient's response to treatment, examination of patient, ordering and  review of laboratory studies, ordering and review of radiographic studies, ordering and performing treatments and interventions, pulse oximetry, re-evaluation of patient's condition and review of old charts     Medications Ordered in ED Medications  amiodarone (NEXTERONE) 1.8 mg/mL load via infusion 150 mg (150 mg Intravenous Bolus from Bag 04/15/24 1842)    Followed by  amiodarone (NEXTERONE PREMIX) 360-4.14 MG/200ML-% (1.8 mg/mL) IV infusion (60 mg/hr Intravenous New Bag/Given 04/15/24 2144)    Followed by  amiodarone (NEXTERONE PREMIX) 360-4.14 MG/200ML-% (1.8 mg/mL) IV infusion (has no administration in time range)  doxycycline  (VIBRAMYCIN ) 100 mg in sodium chloride  0.9 % 250 mL IVPB (100 mg Intravenous New Bag/Given 04/15/24 2031)  heparin ADULT infusion 100 units/mL (25000 units/250mL) (1,150 Units/hr Intravenous New Bag/Given 04/15/24 2008)  sodium chloride  0.9 % bolus 1,000 mL (1,000 mLs Intravenous New Bag/Given 04/15/24 1949)  cefTRIAXone (ROCEPHIN) 2 g in sodium chloride  0.9 % 100 mL IVPB (0 g Intravenous Stopped 04/15/24 2022)  heparin bolus via infusion 4,000 Units (4,000 Units Intravenous Bolus from Bag 04/15/24 2009)    ED Course/ Medical Decision  Making/ A&P                                 Medical Decision Making Amount and/or Complexity of Data Reviewed Labs: ordered.    Details: Lactic acidosis.  Normal troponin. Radiology: ordered and independent interpretation performed.    Details: Likely left lower lobe pneumonia ECG/medicine tests: ordered and independent interpretation performed.    Details: A-fib with RVR  Risk Prescription drug management. Decision regarding hospitalization.   Patient presents with shortness of breath.  Likely related to his heart rate though there is a possible pneumonia that he will be treated for.  He was treated with amiodarone given his borderline blood pressure.  He is not an outright shocks I do not think an emergent cardioversion is needed.  Amiodarone is primarily being used for rate control though will also as further talking to patient seems like he is actually been having palpitations for quite some time but never sought medical care.  I did consult cardiology, Dr. Maximo Spar, who agrees with this treatment plan and will see the patient and help manage.  Otherwise was given antibiotics for pneumonia and will need admission.  Discussed with Dr. [facility].  His respiratory status and symptoms have improved with the heart rate improvement.        Final Clinical Impression(s) / ED Diagnoses Final diagnoses:  Atrial fibrillation with RVR (HCC)  Community acquired pneumonia of left lower lobe of lung    Rx / DC Orders ED Discharge Orders     None         Jerilynn Montenegro, MD 04/15/24 2232

## 2024-04-15 NOTE — ED Triage Notes (Signed)
 Patient arrives via [facility] from home for respiratory distress. Sudden onswet shob, hx of asthma- tried home inhaler with no relief. Coarse wheezes In all fields. Hypoxic agitation- tolerated CPAP well. Upper abdominal pain- different than acid reflux pain per patient. Hx of irregular heart beat but denies afib.   125solumedrol 10 albuterol  0.5 atrovent  HR 220

## 2024-04-15 NOTE — ED Notes (Signed)
 RT at bedside.

## 2024-04-15 NOTE — ED Notes (Signed)
 Patient placed on zoll pads by RN

## 2024-04-15 NOTE — H&P (Signed)
 History and Physical    Vincent Marquez:[id number] DOB: Sep 23, 1951 DOA: 04/15/2024  Patient coming from: Home.  Chief Complaint: Shortness of breath.  HPI: Vincent Marquez is a 73 y.o. male with history of COPD, chronic iron overload status with anemia being followed by oncologist, GERD, tobacco abuse presents to the ER with complaints of shortness of breath.  Patient states he has chronic shortness of breath but over the last one week has acutely worsened.  Denies any chest pain productive cough or fever or chills.  Has chronic abdominal discomfort.  ED Course: In the ER patient is found to be in A-fib with RVR initially was mildly hypotensive.  Was started on amiodarone infusion.  Chest x-ray was showing infiltrates concerning for pneumonia and was started on antibiotics.  Cardiology was consulted.  Patient admitted for new onset A-fib with RVR and possible pneumonia.  Patient was initially placed on CPAP which was weaned off.  Labs showed BNP of 203 and troponins were negative x 2.  Creatinine 0.8.  Magnesium was 1.5.  Review of Systems: As per HPI, rest all negative.   Past Medical History:  Diagnosis Date   Anemia    COPD (chronic obstructive pulmonary disease) (HCC)    GERD (gastroesophageal reflux disease)    Hypertension     Past Surgical History:  Procedure Laterality Date   COLONOSCOPY     MULTIPLE TOOTH EXTRACTIONS       reports that he has been smoking cigarettes. He has a 50 pack-year smoking history. He has never used smokeless tobacco. He reports current alcohol use. He reports that he does not use drugs.  No Known Allergies  Family History  Problem Relation Age of Onset   Cancer Mother    Cancer Sister        gastric cancer   Other Sister        Unknown stomach issues   Cancer Sister        gastric cancer   Other Sister        Stomach issues, possibly cancer   Colon cancer Neg Hx    Esophageal cancer Neg Hx    Rectal cancer Neg Hx    Stomach cancer Neg Hx      Prior to Admission medications   Medication Sig Start Date End Date Taking? Authorizing Provider  albuterol  (PROVENTIL  HFA;VENTOLIN  HFA) 108 (90 Base) MCG/ACT inhaler Inhale 1-2 puffs into the lungs every 6 (six) hours as needed for wheezing or shortness of breath. 01/15/19  Yes Ethlyn Herd, MD  Fluticasone-Umeclidin-Vilant 100-62.5-25 MCG/INH AEPB Inhale into the lungs.   Yes [provider]  omeprazole (PRILOSEC) 20 MG capsule Take 20 mg by mouth daily.   Yes [provider]  sildenafil (VIAGRA) 50 MG tablet Take 50 mg by mouth daily as needed for erectile dysfunction.   Yes [provider]  naproxen  (NAPROSYN ) 375 MG tablet Take 1 tablet (375 mg total) by mouth 2 (two) times daily. Patient not taking: Reported on 04/15/2024 10/18/22   Alissa April, MD  orphenadrine  (NORFLEX ) 100 MG tablet Take 1 tablet (100 mg total) by mouth 2 (two) times daily. Patient not taking: Reported on 04/15/2024 10/18/22   Alissa April, MD    Physical Exam: Constitutional: Moderately built and nourished. Vitals:   04/15/24 2000 04/15/24 2045 04/15/24 2130 04/15/24 2230  BP: (!) 136/119 (!) 128/98 104/68 120/66  Pulse:    92  Resp: (!) 28 (!) 31 14 19   Temp:  98 F (36.7 C)  TempSrc:    Oral  SpO2:    97%  Weight:      Height:       Eyes: Anicteric no pallor. ENMT: No discharge from the ears eyes nose or mouth. Neck: No mass felt.  No neck rigidity. Respiratory: No rhonchi or crepitations. Cardiovascular: S1-S2 heard. Abdomen: Soft nontender bowel sounds present. Musculoskeletal: No edema. Skin: No rash. Neurologic: Alert awake oriented to time place and person.  Moves all extremities. Psychiatric: Appears normal.  Normal affect.   Labs on Admission: I have personally reviewed following labs and imaging studies  CBC: Recent Labs  Lab 04/15/24 1818 04/15/24 1839  WBC 8.7  --   NEUTROABS 2.1  --   HGB 9.3* 11.9*  HCT 29.0* 35.0*  MCV 86.3  --   PLT 388  --     Basic Metabolic Panel: Recent Labs  Lab 04/15/24 1818 04/15/24 1839  NA 138 139  K 4.0 4.0  CL 101 98  CO2 27  --   GLUCOSE 157* 161*  BUN 12 12  CREATININE 0.90 0.80  CALCIUM 8.9  --   MG 1.5*  --    GFR: Estimated Creatinine Clearance: 85.7 mL/min (by C-G formula based on SCr of 0.8 mg/dL). Liver Function Tests: Recent Labs  Lab 04/15/24 1818  AST 27  ALT 22  ALKPHOS 59  BILITOT 1.5*  PROT 6.4*  ALBUMIN 3.5   Recent Labs  Lab 04/15/24 1818  LIPASE 25   No results for input(s): "AMMONIA" in the last 168 hours. Coagulation Profile: No results for input(s): "INR", "PROTIME" in the last 168 hours. Cardiac Enzymes: No results for input(s): "CKTOTAL", "CKMB", "CKMBINDEX", "TROPONINI" in the last 168 hours. BNP (last 3 results) No results for input(s): "PROBNP" in the last 8760 hours. HbA1C: No results for input(s): "HGBA1C" in the last 72 hours. CBG: No results for input(s): "GLUCAP" in the last 168 hours. Lipid Profile: No results for input(s): "CHOL", "HDL", "LDLCALC", "TRIG", "CHOLHDL", "LDLDIRECT" in the last 72 hours. Thyroid Function Tests: No results for input(s): "TSH", "T4TOTAL", "FREET4", "T3FREE", "THYROIDAB" in the last 72 hours. Anemia Panel: No results for input(s): "VITAMINB12", "FOLATE", "FERRITIN", "TIBC", "IRON", "RETICCTPCT" in the last 72 hours. Urine analysis:    Component Value Date/Time   COLORURINE YELLOW 01/09/2018 1134   APPEARANCEUR CLEAR 01/09/2018 1134   LABSPEC 1.020 01/09/2018 1134   PHURINE 5.0 01/09/2018 1134   GLUCOSEU NEGATIVE 01/09/2018 1134   HGBUR NEGATIVE 01/09/2018 1134   BILIRUBINUR NEGATIVE 01/09/2018 1134   KETONESUR NEGATIVE 01/09/2018 1134   PROTEINUR NEGATIVE 01/09/2018 1134   UROBILINOGEN 0.2 01/29/2009 1713   NITRITE NEGATIVE 01/09/2018 1134   LEUKOCYTESUR NEGATIVE 01/09/2018 1134   Sepsis Labs: @LABRCNTIP (procalcitonin:4,lacticidven:4) ) Recent Results (from the past 240 hours)  Resp panel by RT-PCR  (RSV, Flu A&B, Covid) Anterior Nasal Swab     Status: None   Collection Time: 04/15/24  6:18 PM   Specimen: Anterior Nasal Swab  Result Value Ref Range Status   SARS Coronavirus 2 by RT PCR NEGATIVE NEGATIVE Final   Influenza A by PCR NEGATIVE NEGATIVE Final   Influenza B by PCR NEGATIVE NEGATIVE Final    Comment: (NOTE) The Xpert Xpress SARS-CoV-2/FLU/RSV plus assay is intended as an aid in the diagnosis of influenza from Nasopharyngeal swab specimens and should not be used as a sole basis for treatment. Nasal washings and aspirates are unacceptable for Xpert Xpress SARS-CoV-2/FLU/RSV testing.  Fact Sheet for Patients: BloggerCourse.com  Fact Sheet for Healthcare Providers: SeriousBroker.it  This test is not yet approved or cleared by the [place]  FDA and has been authorized for detection and/or diagnosis of SARS-CoV-2 by FDA under an Emergency Use Authorization (EUA). This EUA will remain in effect (meaning this test can be used) for the duration of the COVID-19 declaration under Section 564(b)(1) of the Act, 21 U.S.C. section 360bbb-3(b)(1), unless the authorization is terminated or revoked.     Resp Syncytial Virus by PCR NEGATIVE NEGATIVE Final    Comment: (NOTE) Fact Sheet for Patients: BloggerCourse.com  Fact Sheet for Healthcare Providers: SeriousBroker.it  This test is not yet approved or cleared by the [place]  FDA and has been authorized for detection and/or diagnosis of SARS-CoV-2 by FDA under an Emergency Use Authorization (EUA). This EUA will remain in effect (meaning this test can be used) for the duration of the COVID-19 declaration under Section 564(b)(1) of the Act, 21 U.S.C. section 360bbb-3(b)(1), unless the authorization is terminated or revoked.  Performed at [facility], [address], [place], [place] [address]      Radiological  Exams on Admission: DG Chest Portable 1 View Result Date: 04/15/2024 CLINICAL DATA:  Respiratory distress. EXAM: PORTABLE CHEST 1 VIEW COMPARISON:  Radiographs 01/15/2019 and 03/27/2013.  CT 10/01/2023. FINDINGS: 1824 hours. The heart size and mediastinal contours are stable. Lower lung volumes with new patchy left basilar airspace disease. The right lung appears clear. No evidence of pneumothorax or significant pleural effusion. The bones appear unremarkable. IMPRESSION: New left basilar atelectasis or early infiltrate.  No edema. Electronically Signed   By: Elmon Hagedorn M.D.   On: 04/15/2024 18:42    EKG: Independently reviewed.  A-fib with RVR.  Assessment/Plan Principal Problem:   Atrial fibrillation with RVR (HCC) Active Problems:   Abdominal pain, chronic, epigastric   Hemosiderosis   Anemia   Atrial fibrillation with rapid ventricular response (HCC)   Pneumonia    A-fib with RVR new onset appreciate cardiology consult.  Presently on amiodarone infusion and is started on a heparin infusion.  Checking 2D echo TSH.  Will await further recommendations from cardiology. Possible pneumonia on empiric antibiotics. Chronic abdominal discomfort will check CT scan of the abdomen pelvis. COPD not actively wheezing continue home inhaler Wixela. Chronic iron overload status with anemia being followed by oncologist. GERD on PPI. Hyperglycemia check hemoglobin A1c. Hypomagnesemia replace and recheck. Lactic acidosis no definite signs of sepsis.  Will continue to monitor.  Since patient has new onset A-fib with RVR will need close monitoring and more than 2 midnight stay.   DVT prophylaxis: Heparin infusion. Code Status: Full code. Family Communication: Discussed with patient. Disposition Plan: Progressive care. Consults called: Cardiology. Admission status: Observation.

## 2024-04-15 NOTE — ED Notes (Signed)
 Patient trialed off bipap by RT, oxygen saturation 93-95 on room air. MD notified.

## 2024-04-16 ENCOUNTER — Observation Stay (HOSPITAL_COMMUNITY)

## 2024-04-16 ENCOUNTER — Telehealth (HOSPITAL_COMMUNITY): Payer: Self-pay | Admitting: Pharmacy Technician

## 2024-04-16 ENCOUNTER — Other Ambulatory Visit (HOSPITAL_COMMUNITY): Payer: Self-pay

## 2024-04-16 DIAGNOSIS — D649 Anemia, unspecified: Secondary | ICD-10-CM

## 2024-04-16 DIAGNOSIS — I4891 Unspecified atrial fibrillation: Secondary | ICD-10-CM | POA: Diagnosis present

## 2024-04-16 DIAGNOSIS — F17210 Nicotine dependence, cigarettes, uncomplicated: Secondary | ICD-10-CM | POA: Diagnosis present

## 2024-04-16 DIAGNOSIS — J449 Chronic obstructive pulmonary disease, unspecified: Secondary | ICD-10-CM | POA: Insufficient documentation

## 2024-04-16 DIAGNOSIS — R739 Hyperglycemia, unspecified: Secondary | ICD-10-CM | POA: Diagnosis present

## 2024-04-16 DIAGNOSIS — K2970 Gastritis, unspecified, without bleeding: Secondary | ICD-10-CM | POA: Insufficient documentation

## 2024-04-16 DIAGNOSIS — Z7951 Long term (current) use of inhaled steroids: Secondary | ICD-10-CM | POA: Diagnosis not present

## 2024-04-16 DIAGNOSIS — Z716 Tobacco abuse counseling: Secondary | ICD-10-CM | POA: Diagnosis not present

## 2024-04-16 DIAGNOSIS — R0603 Acute respiratory distress: Secondary | ICD-10-CM | POA: Diagnosis present

## 2024-04-16 DIAGNOSIS — Z79899 Other long term (current) drug therapy: Secondary | ICD-10-CM | POA: Diagnosis not present

## 2024-04-16 DIAGNOSIS — I110 Hypertensive heart disease with heart failure: Secondary | ICD-10-CM | POA: Diagnosis present

## 2024-04-16 DIAGNOSIS — J9811 Atelectasis: Secondary | ICD-10-CM | POA: Diagnosis present

## 2024-04-16 DIAGNOSIS — I959 Hypotension, unspecified: Secondary | ICD-10-CM | POA: Diagnosis present

## 2024-04-16 DIAGNOSIS — N433 Hydrocele, unspecified: Secondary | ICD-10-CM | POA: Diagnosis present

## 2024-04-16 DIAGNOSIS — Z8249 Family history of ischemic heart disease and other diseases of the circulatory system: Secondary | ICD-10-CM | POA: Diagnosis not present

## 2024-04-16 DIAGNOSIS — R1010 Upper abdominal pain, unspecified: Secondary | ICD-10-CM | POA: Diagnosis not present

## 2024-04-16 DIAGNOSIS — Z800 Family history of malignant neoplasm of digestive organs: Secondary | ICD-10-CM | POA: Diagnosis not present

## 2024-04-16 DIAGNOSIS — G8929 Other chronic pain: Secondary | ICD-10-CM | POA: Diagnosis present

## 2024-04-16 DIAGNOSIS — I5032 Chronic diastolic (congestive) heart failure: Secondary | ICD-10-CM | POA: Diagnosis present

## 2024-04-16 DIAGNOSIS — E872 Acidosis, unspecified: Secondary | ICD-10-CM | POA: Diagnosis present

## 2024-04-16 DIAGNOSIS — Z1152 Encounter for screening for COVID-19: Secondary | ICD-10-CM | POA: Diagnosis not present

## 2024-04-16 DIAGNOSIS — Z7901 Long term (current) use of anticoagulants: Secondary | ICD-10-CM | POA: Diagnosis not present

## 2024-04-16 DIAGNOSIS — K219 Gastro-esophageal reflux disease without esophagitis: Secondary | ICD-10-CM | POA: Diagnosis present

## 2024-04-16 DIAGNOSIS — E291 Testicular hypofunction: Secondary | ICD-10-CM | POA: Diagnosis present

## 2024-04-16 DIAGNOSIS — Z8719 Personal history of other diseases of the digestive system: Secondary | ICD-10-CM | POA: Diagnosis not present

## 2024-04-16 LAB — COMPREHENSIVE METABOLIC PANEL WITH GFR
ALT: 19 U/L (ref 0–44)
AST: 29 U/L (ref 15–41)
Albumin: 3.3 g/dL — ABNORMAL LOW (ref 3.5–5.0)
Alkaline Phosphatase: 54 U/L (ref 38–126)
Anion gap: 9 (ref 5–15)
BUN: 12 mg/dL (ref 8–23)
CO2: 24 mmol/L (ref 22–32)
Calcium: 8.2 mg/dL — ABNORMAL LOW (ref 8.9–10.3)
Chloride: 102 mmol/L (ref 98–111)
Creatinine, Ser: 0.82 mg/dL (ref 0.61–1.24)
GFR, Estimated: >60 mL/min (ref >60)
Glucose, Bld: 169 mg/dL — ABNORMAL HIGH (ref 70–99)
Potassium: 4.7 mmol/L (ref 3.5–5.1)
Sodium: 135 mmol/L (ref 135–145)
Total Bilirubin: 0.7 mg/dL (ref 0.0–1.2)
Total Protein: 5.8 g/dL — ABNORMAL LOW (ref 6.5–8.1)

## 2024-04-16 LAB — ECHOCARDIOGRAM COMPLETE
Area-P 1/2: 4.31 cm2
Height: 67 in
S' Lateral: 2.9 cm
Weight: 2811.31 [oz_av]

## 2024-04-16 LAB — RETICULOCYTES
Immature Retic Fract: 3.4 % (ref 2.3–15.9)
RBC.: 3.02 MIL/uL — ABNORMAL LOW (ref 4.22–5.81)
Retic Ct Pct: <0.4 % — ABNORMAL LOW (ref 0.4–3.1)

## 2024-04-16 LAB — IRON AND TIBC
Iron: 210 ug/dL — ABNORMAL HIGH (ref 45–182)
Saturation Ratios: 92 % — ABNORMAL HIGH (ref 17.9–39.5)
TIBC: 230 ug/dL — ABNORMAL LOW (ref 250–450)
UIBC: 20 ug/dL

## 2024-04-16 LAB — HEMOGLOBIN A1C
Hgb A1c MFr Bld: 5.5 % (ref 4.8–5.6)
Mean Plasma Glucose: 111.15 mg/dL

## 2024-04-16 LAB — MAGNESIUM
Magnesium: 1.7 mg/dL (ref 1.7–2.4)
Magnesium: 1.8 mg/dL (ref 1.7–2.4)

## 2024-04-16 LAB — RAPID URINE DRUG SCREEN, HOSP PERFORMED
Amphetamines: NOT DETECTED
Barbiturates: NOT DETECTED
Benzodiazepines: NOT DETECTED
Cocaine: NOT DETECTED
Opiates: NOT DETECTED
Tetrahydrocannabinol: POSITIVE — AB

## 2024-04-16 LAB — LACTIC ACID, PLASMA: Lactic Acid, Venous: 1.8 mmol/L (ref 0.5–1.9)

## 2024-04-16 LAB — PROCALCITONIN: Procalcitonin: <0.1 ng/mL

## 2024-04-16 LAB — HEPARIN LEVEL (UNFRACTIONATED)
Heparin Unfractionated: 0.15 [IU]/mL — ABNORMAL LOW (ref 0.30–0.70)
Heparin Unfractionated: <0.1 [IU]/mL — ABNORMAL LOW (ref 0.30–0.70)

## 2024-04-16 LAB — VITAMIN B12: Vitamin B-12: 378 pg/mL (ref 180–914)

## 2024-04-16 LAB — CBC
HCT: 26.3 % — ABNORMAL LOW (ref 39.0–52.0)
Hemoglobin: 8.4 g/dL — ABNORMAL LOW (ref 13.0–17.0)
MCH: 27.5 pg (ref 26.0–34.0)
MCHC: 31.9 g/dL (ref 30.0–36.0)
MCV: 86.2 fL (ref 80.0–100.0)
Platelets: 329 10*3/uL (ref 150–400)
RBC: 3.05 MIL/uL — ABNORMAL LOW (ref 4.22–5.81)
RDW: 38.2 % — ABNORMAL HIGH (ref 11.5–15.5)
WBC: 3.7 10*3/uL — ABNORMAL LOW (ref 4.0–10.5)
nRBC: 0 % (ref 0.0–0.2)

## 2024-04-16 LAB — TSH: TSH: 0.552 u[IU]/mL (ref 0.350–4.500)

## 2024-04-16 LAB — FERRITIN: Ferritin: 865 ng/mL — ABNORMAL HIGH (ref 24–336)

## 2024-04-16 LAB — FOLATE: Folate: 6.5 ng/mL (ref >5.9)

## 2024-04-16 MED ORDER — MAGNESIUM SULFATE 2 GM/50ML IV SOLN: 2.0000 g | Freq: Once | INTRAVENOUS | Status: DC

## 2024-04-16 MED ORDER — FLUTICASONE FUROATE-VILANTEROL 100-25 MCG/ACT IN AEPB
1.0000 | INHALATION_SPRAY | Freq: Every day | RESPIRATORY_TRACT | Status: DC
  Administered 2024-04-17: 1 via RESPIRATORY_TRACT
  Filled 2024-04-16: qty 28

## 2024-04-16 MED ORDER — APIXABAN 5 MG PO TABS
5.0000 mg | ORAL_TABLET | Freq: Two times a day (BID) | ORAL | Status: DC
  Administered 2024-04-16 – 2024-04-17 (×3): 5 mg via ORAL
  Filled 2024-04-16 (×3): qty 1

## 2024-04-16 MED ORDER — NICOTINE 21 MG/24HR TD PT24
21.0000 mg | MEDICATED_PATCH | Freq: Every day | TRANSDERMAL | Status: DC
  Administered 2024-04-16 – 2024-04-17 (×2): 21 mg via TRANSDERMAL
  Filled 2024-04-16 (×2): qty 1

## 2024-04-16 MED ORDER — UMECLIDINIUM BROMIDE 62.5 MCG/ACT IN AEPB
1.0000 | INHALATION_SPRAY | Freq: Every day | RESPIRATORY_TRACT | Status: DC
  Administered 2024-04-17: 1 via RESPIRATORY_TRACT
  Filled 2024-04-16: qty 7

## 2024-04-16 MED ORDER — PANTOPRAZOLE SODIUM 40 MG PO TBEC
40.0000 mg | DELAYED_RELEASE_TABLET | Freq: Every day | ORAL | Status: DC
  Administered 2024-04-16 – 2024-04-17 (×2): 40 mg via ORAL
  Filled 2024-04-16 (×2): qty 1

## 2024-04-16 MED ORDER — MAGNESIUM SULFATE 2 GM/50ML IV SOLN
2.0000 g | Freq: Once | INTRAVENOUS | Status: AC
  Administered 2024-04-16: 2 g via INTRAVENOUS
  Filled 2024-04-16: qty 50

## 2024-04-16 MED ORDER — LORAZEPAM 2 MG/ML IJ SOLN: 0.5000 mg | Freq: Once | INTRAMUSCULAR | Status: DC

## 2024-04-16 NOTE — Care Management Obs Status (Signed)
 MEDICARE OBSERVATION STATUS NOTIFICATION   Patient Details  Name: Vincent Marquez MRN: [id number] Date of Birth: [birth year]   Medicare Observation Status Notification Given:  Yes  [place]/Obs letter done and signed  Wynonia Hedges 04/16/2024, 10:54 AM

## 2024-04-16 NOTE — Telephone Encounter (Signed)
 Patient Product/process development scientist completed.    The patient is insured through [facility]. Patient has Medicare and is not eligible for a copay card, but may be able to apply for patient assistance or Medicare RX Payment Plan (Patient Must reach out to their plan, if eligible for payment plan), if available.    Ran test claim for Eliquis 5 mg and the current 30 day co-pay is $0.00.  Ran test claim for Xarelto 20 mg and the current 30 day co-pay is $0.00.  This test claim was processed through [facility]- copay amounts may vary at other pharmacies due to pharmacy/plan contracts, or as the patient moves through the different stages of their insurance plan.     Roland Earl, CPHT Pharmacy Technician III Certified Patient Advocate [facility] Patient Advocate Team Direct Number: [phone]  Fax: [phone]

## 2024-04-16 NOTE — ED Notes (Signed)
 Pt care taken, resting, has amiodarone and heparin running. Gave patient soda.

## 2024-04-16 NOTE — Progress Notes (Incomplete)
 PHARMACY - ANTICOAGULATION CONSULT NOTE  Pharmacy Consult for heparin  Indication: atrial fibrillation  No Known Allergies  Patient Measurements: Height: 5\' 7"  (170.2 cm) Weight: 79.7 kg (175 lb 11.3 oz) IBW/kg (Calculated) : 66.1 HEPARIN DW (KG): 79.7  Vital Signs: Temp: 98.2 F (36.8 C) (05/07 0732) Temp Source: Oral (05/07 0732) BP: 131/92 (05/07 0732) Pulse Rate: 76 (05/07 0732)  Labs: Recent Labs    04/15/24 1818 04/15/24 1839 04/15/24 1959 04/16/24 0234 04/16/24 0411 04/16/24 1318  HGB 9.3* 11.9*  --   --  8.4*  --   HCT 29.0* 35.0*  --   --  26.3*  --   PLT 388  --   --   --  329  --   HEPARINUNFRC  --   --   --   --  0.15* <0.10*  CREATININE 0.90 0.80  --  0.82  --   --   TROPONINIHS 5  --  5  --   --   --     Estimated Creatinine Clearance: 83.6 mL/min (by C-G formula based on SCr of 0.82 mg/dL).   Medical History: Past Medical History:  Diagnosis Date   Anemia    COPD (chronic obstructive pulmonary disease) (HCC)    GERD (gastroesophageal reflux disease)    Hypertension     Assessment: Patient admitted with CC of respiratory distress and shortness of breath. Found to be in Afib, not on anticoagulation PTA. CBC in process but last in 01/2024 was within normal limits. Pharmacy consulted to dose heparin.   Goal of Therapy:  Heparin level 0.3-0.7 units/ml Monitor platelets by anticoagulation protocol: Yes   Plan:  Increase heparin IV to 1600 units/hr 8 hour heparin level  Daily heparin level, CBC

## 2024-04-16 NOTE — Progress Notes (Addendum)
 Rounding Note    Patient Name: IAM SENDER Date of Encounter: 04/16/2024  [facility] Cardiologist: Hazle Lites, MD   Subjective   Pt seen while getting echo, wants to discharge  Inpatient Medications    Scheduled Meds:  fluticasone furoate-vilanterol  1 puff Inhalation Daily   pantoprazole   40 mg Oral Daily   Continuous Infusions:  amiodarone 30 mg/hr (04/16/24 0538)   heparin 1,300 Units/hr (04/16/24 0530)   PRN Meds:    Vital Signs    Vitals:   04/16/24 0414 04/16/24 0516 04/16/24 0522 04/16/24 0732  BP: 122/88  117/74 (!) 131/92  Pulse: 72  72 76  Resp: 18  18 19   Temp: 98.2 F (36.8 C)  98.2 F (36.8 C) 98.2 F (36.8 C)  TempSrc: Oral  Oral Oral  SpO2: 99%  95% 94%  Weight:  79.7 kg    Height:  5\' 7"  (1.702 m)      Intake/Output Summary (Last 24 hours) at 04/16/2024 1143 Last data filed at 04/16/2024 0530 Gross per 24 hour  Intake 490.27 ml  Output 450 ml  Net 40.27 ml      04/16/2024    5:16 AM 04/15/2024    6:16 PM 01/29/2024    9:07 AM  Last 3 Weights  Weight (lbs) 175 lb 11.3 oz 182 lb 15.7 oz 183 lb 3.2 oz  Weight (kg) 79.7 kg 83 kg 83.099 kg      Telemetry    Sinus rhythm with PACs in the 80s - Personally Reviewed  ECG    No new tracings - Personally Reviewed  Physical Exam   GEN: No acute distress.   Neck: No JVD Cardiac: RRR, no murmurs, rubs, or gallops.  Respiratory: wheezing bilaterally GI: Soft, nontender, non-distended  MS: No edema; No deformity. Neuro:  Nonfocal  Psych: Normal affect   Labs    High Sensitivity Troponin:   Recent Labs  Lab 04/15/24 1818 04/15/24 1959  TROPONINIHS 5 5     Chemistry Recent Labs  Lab 04/15/24 1818 04/15/24 1839 04/16/24 0234 04/16/24 0411 04/16/24 1052  NA 138 139 135  --   --   K 4.0 4.0 4.7  --   --   CL 101 98 102  --   --   CO2 27  --  24  --   --   GLUCOSE 157* 161* 169*  --   --   BUN 12 12 12   --   --   CREATININE 0.90 0.80 0.82  --   --   CALCIUM  8.9  --  8.2*  --   --   MG 1.5*  --   --  1.7 1.8  PROT 6.4*  --  5.8*  --   --   ALBUMIN 3.5  --  3.3*  --   --   AST 27  --  29  --   --   ALT 22  --  19  --   --   ALKPHOS 59  --  54  --   --   BILITOT 1.5*  --  0.7  --   --   GFRNONAA >60  --  >60  --   --   ANIONGAP 10  --  9  --   --     Lipids No results for input(s): "CHOL", "TRIG", "HDL", "LABVLDL", "LDLCALC", "CHOLHDL" in the last 168 hours.  Hematology Recent Labs  Lab 04/15/24 1818 04/15/24 1839 04/16/24 0234  04/16/24 0411  WBC 8.7  --   --  3.7*  RBC 3.36*  --  3.02* 3.05*  HGB 9.3* 11.9*  --  8.4*  HCT 29.0* 35.0*  --  26.3*  MCV 86.3  --   --  86.2  MCH 27.7  --   --  27.5  MCHC 32.1  --   --  31.9  RDW 37.2*  --   --  38.2*  PLT 388  --   --  329   Thyroid  Recent Labs  Lab 04/16/24 0234  TSH 0.552    BNP Recent Labs  Lab 04/15/24 1818  BNP 203.1*    DDimer No results for input(s): "DDIMER" in the last 168 hours.   Radiology    US  Abdomen Limited RUQ (LIVER/GB) Result Date: 04/16/2024 CLINICAL DATA:  Gallstone. EXAM: ULTRASOUND ABDOMEN LIMITED RIGHT UPPER QUADRANT COMPARISON:  CT AP from 04/15/2024 FINDINGS: Gallbladder: No gallstones or gallbladder wall thickening. Equivocal for sludge within the dependent portion of the gallbladder. No pericholecystic fluid. Common bile duct: Diameter: 2.2 mm Liver: No focal lesion identified. Within normal limits in parenchymal echogenicity. Portal vein is patent on color Doppler imaging with normal direction of blood flow towards the liver. Other: None. IMPRESSION: 1. No gallstones or gallbladder wall thickening. 2. Equivocal for sludge within the dependent portion of the gallbladder. Electronically Signed   By: Kimberley Penman M.D.   On: 04/16/2024 05:38   CT ABDOMEN PELVIS WO CONTRAST Result Date: 04/16/2024 CLINICAL DATA:  Acute nonlocalized abdominal pain EXAM: CT ABDOMEN AND PELVIS WITHOUT CONTRAST TECHNIQUE: Multidetector CT imaging of the abdomen and pelvis  was performed following the standard protocol without IV contrast. RADIATION DOSE REDUCTION: This exam was performed according to the departmental dose-optimization program which includes automated exposure control, adjustment of the mA and/or kV according to patient size and/or use of iterative reconstruction technique. COMPARISON:  MRI abdomen 11/15/2021 and CT abdomen 10/17/2021 FINDINGS: Lower chest: No acute abnormality. Hepatobiliary: Layering sludge in the gallbladder. No evidence of cholecystitis. No biliary dilation. Unremarkable noncontrast appearance of the liver. Pancreas: Unremarkable. Spleen: Unremarkable. Adrenals/Urinary Tract: Normal adrenal glands. No urinary calculi or hydronephrosis. Unremarkable bladder. Stomach/Bowel: No bowel obstruction or bowel wall thickening. Colonic diverticulosis without diverticulitis. Stomach is within normal limits. Appendix is normal. Vascular/Lymphatic: Advanced aortic atherosclerotic calcification. No lymphadenopathy. Reproductive: Prostate is unremarkable. Large left and moderate right hydroceles Other: No free intraperitoneal fluid or air. Musculoskeletal: No acute fracture. IMPRESSION: 1. Large left and moderate right hydroceles. 2. Cholelithiasis. 3. Aortic Atherosclerosis (ICD10-I70.0). Electronically Signed   By: Rozell Cornet M.D.   On: 04/16/2024 00:01   DG Chest Portable 1 View Result Date: 04/15/2024 CLINICAL DATA:  Respiratory distress. EXAM: PORTABLE CHEST 1 VIEW COMPARISON:  Radiographs 01/15/2019 and 03/27/2013.  CT 10/01/2023. FINDINGS: 1824 hours. The heart size and mediastinal contours are stable. Lower lung volumes with new patchy left basilar airspace disease. The right lung appears clear. No evidence of pneumothorax or significant pleural effusion. The bones appear unremarkable. IMPRESSION: New left basilar atelectasis or early infiltrate.  No edema. Electronically Signed   By: Elmon Hagedorn M.D.   On: 04/15/2024 18:42    Cardiac  Studies   Echo pending  Patient Profile     73 y.o. male with hx of COPD found to be in new onset Afib RVR.   Assessment & Plan    Atrial fibrillation with RVR with hypotension - now SR in the 80s with PACs - he remains on  IV amiodarone - started due to RVR and hypotension - anticoagulated with heparin gtt - can likely transition to PO amiodarone tomorrow - BP improving, may be able to add BB this afternoon   SOB - echo pending - CXR concerning for PNA, on ABX   Echo images reviewed, appears to have good BiV function. Will await final read.   Pt is anxious to remove lines. If needed, can transition to PO amiodarone 200 mg BID x 14 days, then 200 mg daily. Can also transition to oral anticoagulation when OK with primary - OK with 5 mg eliquis BID/      For questions or updates, please contact [facility] Please consult [email or web address] for contact info under        Signed, Lamond Pilot, PA  04/16/2024, 11:43 AM    Attending Note:   The patient was seen and examined.  Agree with assessment and plan as noted above.  Changes made to the above note as needed.  Patient seen and independently examined with Romualdo Cobble, PA .   We discussed all aspects of the encounter. I agree with the assessment and plan as stated above.    Atrial fib:   likely associated with pneumonia.  Has already converted to NSR on IV amio .   He is on heparin.   From a cardiology standpoint, he could be changed to Eliquis unless the medical team has planned procedures .   Will transition the amio to PO tomorrow  Echo results pending  His overall LV function looks goo  Mild R and L atrial enlargement      I have spent a total of 40 minutes with patient reviewing hospital  notes , telemetry, EKGs, labs and examining patient as well as establishing an assessment and plan that was discussed with the patient.  > 50% of time was spent in direct patient care.    [place], Marieta Shorten., MD,  [facility] 04/16/2024, 2:37 PM [address],  [address] Office [phone] Pager [phone]

## 2024-04-16 NOTE — Progress Notes (Signed)
 PROGRESS NOTE  Vincent Marquez WUJ:[id number] DOB: 05-11-1951   PCP: Ulysees Gander, MD  Patient is from: Home.  Independently ambulates at baseline.  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   In ED, in RVR with HR to 180s.  Tachypneic to 29.  Slightly hypotensive. CXR with left basilar atelectasis or early infiltrate.  Serial troponin negative.  BNP 203.  Cardiology consulted.  Started on amiodarone and heparin drip, and admitted.  Also started on ceftriaxone doxycycline  for possible pneumonia.  Patient converted to normal sinus rhythm overnight.  Remains on amiodarone drip per cardiology.  Antibiotic discontinued.  Pneumonia rule out.  Subjective: Seen and examined earlier this morning.  No major events overnight of this morning.  No complaints other than abdominal pain from hunger.  Denies chest pain, shortness of breath, unusual cough.  Reports smoking about a pack a day.  Admits to drinking alcohol occasionally but denies binging.  Denies recreational drug use.  Objective: Vitals:   04/16/24 0516 04/16/24 0522 04/16/24 0732 04/16/24 1542  BP:  117/74 (!) 131/92 139/71  Pulse:  72 76 68  Resp:  18 19 20   Temp:  98.2 F (36.8 C) 98.2 F (36.8 C) 98.3 F (36.8 C)  TempSrc:  Oral Oral Oral  SpO2:  95% 94% 97%  Weight: 79.7 kg     Height: 5\' 7"  (1.702 m)       Examination:  GENERAL: No apparent distress.  Nontoxic. HEENT: MMM.  Vision and hearing grossly intact.  NECK: Supple.  No apparent JVD.  RESP:  No IWOB.  Fair aeration bilaterally. CVS:  RRR. Heart sounds normal.  ABD/GI/GU: BS+. Abd soft.  Mild epigastric tenderness. MSK/EXT:  Moves  extremities. No apparent deformity. No edema.  SKIN: no apparent skin lesion or wound NEURO: Awake, alert and oriented appropriately.  No apparent focal neuro deficit. PSYCH: Calm. Normal affect.   Consultants:  Cardiology  Procedures: None  Microbiology summarized: COVID-19, influenza and RSV PCR nonreactive Blood cultures NGTD  Assessment and plan: New onset A-fib with RVR: Unclear etiology of this.  TTE with LVEF of 60 to 65%, G2 DD.  TSH normal.  Denies heavy drinking or recreational drug use.  Started on amiodarone drip and converted to normal sinus rhythm.  - Appreciate help by cardiology - On IV amiodarone and Eliquis - Optimize electrolytes  HFpEF: TTE as above.  Appears euvolemic on exam.  Currently no cardiopulmonary symptoms. - Monitor fluid status  Chronic abdominal discomfort: CT abdomen and pelvis suggested cholelithiasis and large left and moderate right hydroceles.  RUQ US  ordered and negative for cholelithiasis but possible sludge.  He has mild epigastric tenderness that he attributes to not eating. He was n.p.o. since admission.  Denies melena or hematochezia.  Has history of gastritis -P.o. Protonix  40 mg daily  Anemia/hemosiderosis: Baseline Hgb seems to be about 9.  Slight drop from admission likely hemodilution.  No overt bleeding.  Iron level 210, saturation 92% and ferritin 865 Recent Labs    01/29/24 0851 04/15/24 1818 04/15/24 1839 04/16/24 0411  HGB 9.4* 9.3* 11.9* 8.4*  - Outpatient follow-up with hematology - Continue monitoring  Pneumonia ruled out.  No  fever or significant respiratory symptoms.  Dyspnea on presentation likely due to A-fib with RVR.  No significant infiltrate on checks x-ray and CT abdomen.  Pro-Cal negative. - Discontinue antibiotics.  Lactic acidosis: Likely due to hypotension in the setting of A-fib with RVR.  Resolved.  Chronic COPD: Stable -Continue Trelegy Ellipta or hospital formulary -Encouraged smoking  cessation  Tobacco use disorder: Reports smoking a pack a day. -Encouraged smoking cessation -Nicotine patch  Hyperglycemia: Not diabetic.  A1c 5.5%. -Monitor with daily  GERD - PPI  Hypogonadism - Outpatient follow-up  Body mass index is 27.52 kg/m.          DVT prophylaxis:   apixaban (ELIQUIS) tablet 5 mg  Code Status: Full code Family Communication: None at bedside Level of care: Progressive Status is: Observation The patient will require care spanning > 2 midnights and should be moved to inpatient because: A-fib with RVR   Final disposition: Home   55 minutes with more than 50% spent in reviewing records, counseling patient/family and coordinating care.   Sch Meds:  Scheduled Meds:  apixaban  5 mg Oral BID   fluticasone furoate-vilanterol  1 puff Inhalation Daily   pantoprazole   40 mg Oral Daily   Continuous Infusions:  amiodarone 30 mg/hr (04/16/24 0538)   PRN Meds:.  Antimicrobials: Anti-infectives (From admission, onward)    Start     Dose/Rate Route Frequency Ordered Stop   04/16/24 1800  cefTRIAXone (ROCEPHIN) 2 g in sodium chloride  0.9 % 100 mL IVPB  Status:  Discontinued        2 g 200 mL/hr over 30 Minutes Intravenous Every 24 hours 04/15/24 2257 04/16/24 1054   04/16/24 1000  doxycycline  (VIBRAMYCIN ) 100 mg in sodium chloride  0.9 % 250 mL IVPB  Status:  Discontinued        100 mg 125 mL/hr over 120 Minutes Intravenous 2 times daily 04/15/24 2257 04/16/24 1054   04/15/24 1900  cefTRIAXone (ROCEPHIN) 2 g in sodium chloride  0.9 % 100 mL IVPB        2 g 200 mL/hr over 30 Minutes Intravenous Once 04/15/24 1854 04/15/24 2022   04/15/24 1900  doxycycline  (VIBRAMYCIN ) 100 mg in sodium chloride  0.9 % 250 mL IVPB        100 mg 125 mL/hr over 120 Minutes Intravenous  Once 04/15/24 1854 04/15/24 2231        I have personally reviewed the following labs and images: CBC: Recent Labs  Lab 04/15/24 1818 04/15/24 1839 04/16/24 0411  WBC 8.7   --  3.7*  NEUTROABS 2.1  --   --   HGB 9.3* 11.9* 8.4*  HCT 29.0* 35.0* 26.3*  MCV 86.3  --  86.2  PLT 388  --  329   BMP &GFR Recent Labs  Lab 04/15/24 1818 04/15/24 1839 04/16/24 0234 04/16/24 0411 04/16/24 1052  NA 138 139 135  --   --   K 4.0 4.0 4.7  --   --   CL 101 98 102  --   --   CO2 27  --  24  --   --   GLUCOSE 157* 161* 169*  --   --   BUN 12 12 12   --   --   CREATININE 0.90 0.80 0.82  --   --   CALCIUM 8.9  --  8.2*  --   --   MG 1.5*  --   --  1.7 1.8   Estimated Creatinine Clearance: 83.6 mL/min (by  C-G formula based on SCr of 0.82 mg/dL). Liver & Pancreas: Recent Labs  Lab 04/15/24 1818 04/16/24 0234  AST 27 29  ALT 22 19  ALKPHOS 59 54  BILITOT 1.5* 0.7  PROT 6.4* 5.8*  ALBUMIN 3.5 3.3*   Recent Labs  Lab 04/15/24 1818  LIPASE 25   No results for input(s): "AMMONIA" in the last 168 hours. Diabetic: Recent Labs    04/16/24 0410  HGBA1C 5.5   No results for input(s): "GLUCAP" in the last 168 hours. Cardiac Enzymes: No results for input(s): "CKTOTAL", "CKMB", "CKMBINDEX", "TROPONINI" in the last 168 hours. No results for input(s): "PROBNP" in the last 8760 hours. Coagulation Profile: No results for input(s): "INR", "PROTIME" in the last 168 hours. Thyroid Function Tests: Recent Labs    04/16/24 0234  TSH 0.552   Lipid Profile: No results for input(s): "CHOL", "HDL", "LDLCALC", "TRIG", "CHOLHDL", "LDLDIRECT" in the last 72 hours. Anemia Panel: Recent Labs    04/16/24 0234  VITAMINB12 378  FOLATE 6.5  FERRITIN 865*  TIBC 230*  IRON 210*  RETICCTPCT <0.4*   Urine analysis:    Component Value Date/Time   COLORURINE YELLOW 01/09/2018 1134   APPEARANCEUR CLEAR 01/09/2018 1134   LABSPEC 1.020 01/09/2018 1134   PHURINE 5.0 01/09/2018 1134   GLUCOSEU NEGATIVE 01/09/2018 1134   HGBUR NEGATIVE 01/09/2018 1134   BILIRUBINUR NEGATIVE 01/09/2018 1134   KETONESUR NEGATIVE 01/09/2018 1134   PROTEINUR NEGATIVE 01/09/2018 1134    UROBILINOGEN 0.2 01/29/2009 1713   NITRITE NEGATIVE 01/09/2018 1134   LEUKOCYTESUR NEGATIVE 01/09/2018 1134   Sepsis Labs: Invalid input(s): "PROCALCITONIN", "LACTICIDVEN"  Microbiology: Recent Results (from the past 240 hours)  Resp panel by RT-PCR (RSV, Flu A&B, Covid) Anterior Nasal Swab     Status: None   Collection Time: 04/15/24  6:18 PM   Specimen: Anterior Nasal Swab  Result Value Ref Range Status   SARS Coronavirus 2 by RT PCR NEGATIVE NEGATIVE Final   Influenza A by PCR NEGATIVE NEGATIVE Final   Influenza B by PCR NEGATIVE NEGATIVE Final    Comment: (NOTE) The Xpert Xpress SARS-CoV-2/FLU/RSV plus assay is intended as an aid in the diagnosis of influenza from Nasopharyngeal swab specimens and should not be used as a sole basis for treatment. Nasal washings and aspirates are unacceptable for Xpert Xpress SARS-CoV-2/FLU/RSV testing.  Fact Sheet for Patients: BloggerCourse.com  Fact Sheet for Healthcare Providers: SeriousBroker.it  This test is not yet approved or cleared by the [place]  FDA and has been authorized for detection and/or diagnosis of SARS-CoV-2 by FDA under an Emergency Use Authorization (EUA). This EUA will remain in effect (meaning this test can be used) for the duration of the COVID-19 declaration under Section 564(b)(1) of the Act, 21 U.S.C. section 360bbb-3(b)(1), unless the authorization is terminated or revoked.     Resp Syncytial Virus by PCR NEGATIVE NEGATIVE Final    Comment: (NOTE) Fact Sheet for Patients: BloggerCourse.com  Fact Sheet for Healthcare Providers: SeriousBroker.it  This test is not yet approved or cleared by the [place]  FDA and has been authorized for detection and/or diagnosis of SARS-CoV-2 by FDA under an Emergency Use Authorization (EUA). This EUA will remain in effect (meaning this test can be used) for the  duration of the COVID-19 declaration under Section 564(b)(1) of the Act, 21 U.S.C. section 360bbb-3(b)(1), unless the authorization is terminated or revoked.  Performed at [facility], [address], [place], [place] [address]   Culture, blood (routine x 2)  Status: None (Preliminary result)   Collection Time: 04/15/24  7:35 PM   Specimen: BLOOD  Result Value Ref Range Status   Specimen Description BLOOD SITE NOT SPECIFIED  Final   Special Requests   Final    BOTTLES DRAWN AEROBIC AND ANAEROBIC Blood Culture results may not be optimal due to an inadequate volume of blood received in culture bottles   Culture   Final    NO GROWTH < 12 HOURS Performed at [facility], [address], [place], [place] [address]    Report Status PENDING  Incomplete  Culture, blood (routine x 2)     Status: None (Preliminary result)   Collection Time: 04/15/24  7:40 PM   Specimen: BLOOD  Result Value Ref Range Status   Specimen Description BLOOD SITE NOT SPECIFIED  Final   Special Requests   Final    BOTTLES DRAWN AEROBIC AND ANAEROBIC Blood Culture results may not be optimal due to an inadequate volume of blood received in culture bottles   Culture   Final    NO GROWTH < 12 HOURS Performed at [facility], [address], [place], [place] [address]    Report Status PENDING  Incomplete    Radiology Studies: ECHOCARDIOGRAM COMPLETE Result Date: 04/16/2024    ECHOCARDIOGRAM REPORT   Patient Name:   DARIKSON NAGY Donegan Date of Exam: 04/16/2024 Medical Rec #:  [id number]     Height:       67.0 in Accession #:    [id number]    Weight:       175.7 lb Date of Birth:  07/18/1951     BSA:          1.914 m Patient Age:    73 years      BP:           131/92 mmHg Patient Gender: M             HR:           76 bpm. Exam Location:  Inpatient Procedure: 2D Echo, Cardiac Doppler and Color Doppler (Both Spectral and Color            Flow Doppler were utilized during procedure). Indications:    Atrial  Fibrillation  History:        Patient has no prior history of Echocardiogram examinations.  Sonographer:    Janette Medley Referring Phys: 50 ARSHAD N KAKRAKANDY IMPRESSIONS  1. Left ventricular ejection fraction, by estimation, is 60 to 65%. The left ventricle has normal function. The left ventricle has no regional wall motion abnormalities. Left ventricular diastolic parameters are consistent with Grade II diastolic dysfunction (pseudonormalization).  2. Right ventricular systolic function is normal. The right ventricular size is normal. There is normal pulmonary artery systolic pressure. The estimated right ventricular systolic pressure is 27.8 mmHg.  3. The mitral valve is normal in structure. Trivial mitral valve regurgitation. No evidence of mitral stenosis.  4. The aortic valve is normal in structure. Aortic valve regurgitation is not visualized. No aortic stenosis is present.  5. The inferior vena cava is dilated in size with <50% respiratory variability, suggesting right atrial pressure of 15 mmHg. FINDINGS  Left Ventricle: Left ventricular ejection fraction, by estimation, is 60 to 65%. The left ventricle has normal function. The left ventricle has no regional wall motion abnormalities. The left ventricular internal cavity size was normal in size. There is  no left ventricular hypertrophy. Left ventricular diastolic parameters are consistent with  Grade II diastolic dysfunction (pseudonormalization). Right Ventricle: The right ventricular size is normal. No increase in right ventricular wall thickness. Right ventricular systolic function is normal. There is normal pulmonary artery systolic pressure. The tricuspid regurgitant velocity is 1.79 m/s, and  with an assumed right atrial pressure of 15 mmHg, the estimated right ventricular systolic pressure is 27.8 mmHg. Left Atrium: Left atrial size was normal in size. Right Atrium: Right atrial size was normal in size. Pericardium: There is no evidence of  pericardial effusion. Mitral Valve: The mitral valve is normal in structure. Trivial mitral valve regurgitation. No evidence of mitral valve stenosis. Tricuspid Valve: The tricuspid valve is normal in structure. Tricuspid valve regurgitation is not demonstrated. No evidence of tricuspid stenosis. Aortic Valve: The aortic valve is normal in structure. Aortic valve regurgitation is not visualized. No aortic stenosis is present. Pulmonic Valve: The pulmonic valve was normal in structure. Pulmonic valve regurgitation is not visualized. No evidence of pulmonic stenosis. Aorta: The aortic root is normal in size and structure. Venous: The inferior vena cava is dilated in size with less than 50% respiratory variability, suggesting right atrial pressure of 15 mmHg. IAS/Shunts: No atrial level shunt detected by color flow Doppler.  LEFT VENTRICLE PLAX 2D LVIDd:         4.80 cm   Diastology LVIDs:         2.90 cm   LV e' medial:    9.17 cm/s LV PW:         1.00 cm   LV E/e' medial:  10.0 LV IVS:        0.80 cm   LV e' lateral:   8.55 cm/s LVOT diam:     2.10 cm   LV E/e' lateral: 10.7 LV SV:         73 LV SV Index:   38 LVOT Area:     3.46 cm  RIGHT VENTRICLE             IVC RV S prime:     16.00 cm/s  IVC diam: 2.80 cm TAPSE (M-mode): 2.4 cm LEFT ATRIUM             Index        RIGHT ATRIUM           Index LA Vol (A2C):   51.9 ml 27.12 ml/m  RA Area:     19.60 cm LA Vol (A4C):   43.4 ml 22.68 ml/m  RA Volume:   53.10 ml  27.74 ml/m LA Biplane Vol: 50.6 ml 26.44 ml/m  AORTIC VALVE LVOT Vmax:   113.00 cm/s LVOT Vmean:  71.100 cm/s LVOT VTI:    0.212 m  AORTA Ao Root diam: 3.40 cm Ao Asc diam:  3.50 cm MITRAL VALVE               TRICUSPID VALVE MV Area (PHT): 4.31 cm    TR Peak grad:   12.8 mmHg MV Decel Time: 176 msec    TR Vmax:        179.00 cm/s MV E velocity: 91.50 cm/s MV A velocity: 62.80 cm/s  SHUNTS MV E/A ratio:  1.46        Systemic VTI:  0.21 m                            Systemic Diam: 2.10 cm Dorothye Gathers MD  Electronically signed by Dorothye Gathers MD Signature Date/Time: 04/16/2024/3:48:09 PM  Final    US  Abdomen Limited RUQ (LIVER/GB) Result Date: 04/16/2024 CLINICAL DATA:  Gallstone. EXAM: ULTRASOUND ABDOMEN LIMITED RIGHT UPPER QUADRANT COMPARISON:  CT AP from 04/15/2024 FINDINGS: Gallbladder: No gallstones or gallbladder wall thickening. Equivocal for sludge within the dependent portion of the gallbladder. No pericholecystic fluid. Common bile duct: Diameter: 2.2 mm Liver: No focal lesion identified. Within normal limits in parenchymal echogenicity. Portal vein is patent on color Doppler imaging with normal direction of blood flow towards the liver. Other: None. IMPRESSION: 1. No gallstones or gallbladder wall thickening. 2. Equivocal for sludge within the dependent portion of the gallbladder. Electronically Signed   By: Kimberley Penman M.D.   On: 04/16/2024 05:38   CT ABDOMEN PELVIS WO CONTRAST Result Date: 04/16/2024 CLINICAL DATA:  Acute nonlocalized abdominal pain EXAM: CT ABDOMEN AND PELVIS WITHOUT CONTRAST TECHNIQUE: Multidetector CT imaging of the abdomen and pelvis was performed following the standard protocol without IV contrast. RADIATION DOSE REDUCTION: This exam was performed according to the departmental dose-optimization program which includes automated exposure control, adjustment of the mA and/or kV according to patient size and/or use of iterative reconstruction technique. COMPARISON:  MRI abdomen 11/15/2021 and CT abdomen 10/17/2021 FINDINGS: Lower chest: No acute abnormality. Hepatobiliary: Layering sludge in the gallbladder. No evidence of cholecystitis. No biliary dilation. Unremarkable noncontrast appearance of the liver. Pancreas: Unremarkable. Spleen: Unremarkable. Adrenals/Urinary Tract: Normal adrenal glands. No urinary calculi or hydronephrosis. Unremarkable bladder. Stomach/Bowel: No bowel obstruction or bowel wall thickening. Colonic diverticulosis without diverticulitis. Stomach is  within normal limits. Appendix is normal. Vascular/Lymphatic: Advanced aortic atherosclerotic calcification. No lymphadenopathy. Reproductive: Prostate is unremarkable. Large left and moderate right hydroceles Other: No free intraperitoneal fluid or air. Musculoskeletal: No acute fracture. IMPRESSION: 1. Large left and moderate right hydroceles. 2. Cholelithiasis. 3. Aortic Atherosclerosis (ICD10-I70.0). Electronically Signed   By: Rozell Cornet M.D.   On: 04/16/2024 00:01   DG Chest Portable 1 View Result Date: 04/15/2024 CLINICAL DATA:  Respiratory distress. EXAM: PORTABLE CHEST 1 VIEW COMPARISON:  Radiographs 01/15/2019 and 03/27/2013.  CT 10/01/2023. FINDINGS: 1824 hours. The heart size and mediastinal contours are stable. Lower lung volumes with new patchy left basilar airspace disease. The right lung appears clear. No evidence of pneumothorax or significant pleural effusion. The bones appear unremarkable. IMPRESSION: New left basilar atelectasis or early infiltrate.  No edema. Electronically Signed   By: Elmon Hagedorn M.D.   On: 04/15/2024 18:42      Chelsey Kimberley T. Lashaunda Schild [facility]  If 7PM-7AM, please contact night-coverage [email or web address] 04/16/2024, 5:18 PM

## 2024-04-16 NOTE — Progress Notes (Signed)
 PHARMACY - ANTICOAGULATION CONSULT NOTE  Pharmacy Consult for heparin  Indication: atrial fibrillation  No Known Allergies  Patient Measurements: Height: 5\' 6"  (167.6 cm) Weight: 83 kg (182 lb 15.7 oz) IBW/kg (Calculated) : 63.8 HEPARIN DW (KG): 80.7  Vital Signs: Temp: 98.2 F (36.8 C) (05/07 0414) Temp Source: Oral (05/07 0414) BP: 122/88 (05/07 0414) Pulse Rate: 72 (05/07 0414)  Labs: Recent Labs    04/15/24 1818 04/15/24 1839 04/15/24 1959 04/16/24 0234 04/16/24 0411  HGB 9.3* 11.9*  --   --   --   HCT 29.0* 35.0*  --   --   --   PLT 388  --   --   --   --   HEPARINUNFRC  --   --   --   --  0.15*  CREATININE 0.90 0.80  --  0.82  --   TROPONINIHS 5  --  5  --   --     Estimated Creatinine Clearance: 83.6 mL/min (by C-G formula based on SCr of 0.82 mg/dL).   Medical History: Past Medical History:  Diagnosis Date   Anemia    COPD (chronic obstructive pulmonary disease) (HCC)    GERD (gastroesophageal reflux disease)    Hypertension     Assessment: Patient admitted with CC of respiratory distress and shortness of breath. Found to be in Afib, not on anticoagulation PTA. CBC in process but last in 01/2024 was within normal limits. Pharmacy consulted to dose heparin.   5/7 AM update:  Heparin level sub-therapeutic   Goal of Therapy:  Heparin level 0.3-0.7 units/ml Monitor platelets by anticoagulation protocol: Yes   Plan:  Inc heparin to 1300 units/hr Heparin level in 8 hours  Silvestre Drum, PharmD, BCPS Clinical Pharmacist Phone: [phone]

## 2024-04-16 NOTE — Plan of Care (Signed)
  Problem: Education: Goal: Knowledge of General Education information will improve Description: Including pain rating scale, medication(s)/side effects and non-pharmacologic comfort measures Outcome: Progressing   Problem: Health Behavior/Discharge Planning: Goal: Ability to manage health-related needs will improve Outcome: Progressing   Problem: Nutrition: Goal: Adequate nutrition will be maintained Outcome: Progressing   Problem: Coping: Goal: Level of anxiety will decrease Outcome: Progressing   Problem: Pain Managment: Goal: General experience of comfort will improve and/or be controlled Outcome: Progressing   Problem: Safety: Goal: Ability to remain free from injury will improve Outcome: Progressing   Problem: Skin Integrity: Goal: Risk for impaired skin integrity will decrease Outcome: Progressing

## 2024-04-16 NOTE — TOC CM/SW Note (Signed)
 Transition of Care [facility]) - Inpatient Brief Assessment   Patient Details  Name: NERIAH SCHACHER MRN: [id number] Date of Birth: 1951/02/10  [facility] CM/SW Contact:    Juliane Och, LCSW Phone Number: 04/16/2024, 8:58 AM   Clinical Narrative:  8:58 AM Per chart review, patient has a PCP and insurance. Patient does not have SNF/HH/DME history. No TOC needs were identified at this time. TOC will continue to follow and be available to assist.  Transition of Care Asessment: Insurance and Status: Insurance coverage has been reviewed Patient has primary care physician: Yes Home environment has been reviewed: Private Residence Prior level of function:: N/A Prior/Current Home Services: No current home services Social Drivers of Health Review: SDOH reviewed no interventions necessary Readmission risk has been reviewed: Yes Transition of care needs: no transition of care needs at this time

## 2024-04-17 ENCOUNTER — Other Ambulatory Visit (HOSPITAL_COMMUNITY): Payer: Self-pay

## 2024-04-17 DIAGNOSIS — R1010 Upper abdominal pain, unspecified: Secondary | ICD-10-CM

## 2024-04-17 DIAGNOSIS — J449 Chronic obstructive pulmonary disease, unspecified: Secondary | ICD-10-CM | POA: Diagnosis not present

## 2024-04-17 DIAGNOSIS — I4891 Unspecified atrial fibrillation: Secondary | ICD-10-CM | POA: Diagnosis not present

## 2024-04-17 LAB — CBC
HCT: 26.5 % — ABNORMAL LOW (ref 39.0–52.0)
Hemoglobin: 8.6 g/dL — ABNORMAL LOW (ref 13.0–17.0)
MCH: 27.4 pg (ref 26.0–34.0)
MCHC: 32.5 g/dL (ref 30.0–36.0)
MCV: 84.4 fL (ref 80.0–100.0)
Platelets: 358 10*3/uL (ref 150–400)
RBC: 3.14 MIL/uL — ABNORMAL LOW (ref 4.22–5.81)
RDW: 37.3 % — ABNORMAL HIGH (ref 11.5–15.5)
WBC: 4.6 10*3/uL (ref 4.0–10.5)
nRBC: 0.7 % — ABNORMAL HIGH (ref 0.0–0.2)

## 2024-04-17 LAB — BASIC METABOLIC PANEL WITH GFR
Anion gap: 8 (ref 5–15)
BUN: 11 mg/dL (ref 8–23)
CO2: 28 mmol/L (ref 22–32)
Calcium: 8.6 mg/dL — ABNORMAL LOW (ref 8.9–10.3)
Chloride: 101 mmol/L (ref 98–111)
Creatinine, Ser: 0.74 mg/dL (ref 0.61–1.24)
GFR, Estimated: >60 mL/min (ref >60)
Glucose, Bld: 115 mg/dL — ABNORMAL HIGH (ref 70–99)
Potassium: 3.9 mmol/L (ref 3.5–5.1)
Sodium: 137 mmol/L (ref 135–145)

## 2024-04-17 LAB — MAGNESIUM: Magnesium: 2 mg/dL (ref 1.7–2.4)

## 2024-04-17 MED ORDER — LEVALBUTEROL HCL 0.63 MG/3ML IN NEBU
0.6300 mg | INHALATION_SOLUTION | Freq: Once | RESPIRATORY_TRACT | Status: AC | PRN
  Administered 2024-04-17: 0.63 mg via RESPIRATORY_TRACT
  Filled 2024-04-17: qty 3

## 2024-04-17 MED ORDER — AMIODARONE HCL 200 MG PO TABS
200.0000 mg | ORAL_TABLET | Freq: Two times a day (BID) | ORAL | Status: DC
  Administered 2024-04-17: 200 mg via ORAL
  Filled 2024-04-17: qty 1

## 2024-04-17 MED ORDER — LEVALBUTEROL HCL 0.63 MG/3ML IN NEBU: 0.6300 mg | INHALATION_SOLUTION | Freq: Once | RESPIRATORY_TRACT | Status: DC | PRN

## 2024-04-17 MED ORDER — PANTOPRAZOLE SODIUM 40 MG PO TBEC
40.0000 mg | DELAYED_RELEASE_TABLET | Freq: Every day | ORAL | 0 refills | Status: AC
  Filled 2024-04-17: qty 90, 90d supply, fill #0

## 2024-04-17 MED ORDER — AMIODARONE HCL 200 MG PO TABS
ORAL_TABLET | ORAL | 0 refills | Status: DC
  Filled 2024-04-17: qty 97, 90d supply, fill #0

## 2024-04-17 MED ORDER — NICOTINE 21 MG/24HR TD PT24: 21.0000 mg | MEDICATED_PATCH | Freq: Every day | TRANSDERMAL | Status: DC

## 2024-04-17 MED ORDER — APIXABAN 5 MG PO TABS
5.0000 mg | ORAL_TABLET | Freq: Two times a day (BID) | ORAL | 0 refills | Status: AC
  Filled 2024-04-17: qty 180, 90d supply, fill #0

## 2024-04-17 NOTE — Progress Notes (Signed)
 Rounding Note    Patient Name: Vincent Marquez Date of Encounter: 04/17/2024  [facility] Cardiologist: Hazle Lites, MD   Subjective   Patient was admitted with pneumonia, shortness of breath and was found to have atrial fibrillation.  He converted to normal sinus rhythm on IV amiodarone.  He was initially on heparin but this has been changed to Eliquis 5 mg twice a day.  He remains on IV amiodarone. Will transition to PO  amio    Inpatient Medications    Scheduled Meds:  apixaban  5 mg Oral BID   fluticasone furoate-vilanterol  1 puff Inhalation Daily   nicotine  21 mg Transdermal Daily   pantoprazole   40 mg Oral Daily   umeclidinium bromide  1 puff Inhalation Daily   Continuous Infusions:  amiodarone 30 mg/hr (04/17/24 0450)   PRN Meds:    Vital Signs    Vitals:   04/16/24 2038 04/17/24 0000 04/17/24 0421 04/17/24 0717  BP: 137/85 130/73 137/70 (!) 155/94  Pulse: 70 77 77   Resp: 18 18 19  (!) 23  Temp: 98.1 F (36.7 C) 98.3 F (36.8 C) 97.8 F (36.6 C) 97.8 F (36.6 C)  TempSrc: Oral Oral Oral Oral  SpO2: 94% 92% 92% 98%  Weight:      Height:        Intake/Output Summary (Last 24 hours) at 04/17/2024 0851 Last data filed at 04/16/2024 2100 Gross per 24 hour  Intake 600 ml  Output 150 ml  Net 450 ml      04/16/2024    5:16 AM 04/15/2024    6:16 PM 01/29/2024    9:07 AM  Last 3 Weights  Weight (lbs) 175 lb 11.3 oz 182 lb 15.7 oz 183 lb 3.2 oz  Weight (kg) 79.7 kg 83 kg 83.099 kg      Telemetry    Sinus rhythm with PACs in the 80s - Personally Reviewed  ECG    No new tracings - Personally Reviewed  Physical Exam    Physical Exam: Blood pressure (!) 155/94, pulse 77, temperature 97.8 F (36.6 C), temperature source Oral, resp. rate (!) 23, height 5\' 7"  (1.702 m), weight 79.7 kg, SpO2 98%.       GEN:  thin, elderly male  ,  in no acute distress HEENT: Normal NECK: No JVD; No carotid bruits LYMPHATICS: No  lymphadenopathy CARDIAC: RRR , no murmurs, rubs, gallops RESPIRATORY:  bilateral wheezing   ABDOMEN: Soft, non-tender, non-distended MUSCULOSKELETAL:  No edema; No deformity  SKIN: Warm and dry NEUROLOGIC:  Alert and oriented x 3   Labs    High Sensitivity Troponin:   Recent Labs  Lab 04/15/24 1818 04/15/24 1959  TROPONINIHS 5 5     Chemistry Recent Labs  Lab 04/15/24 1818 04/15/24 1839 04/16/24 0234 04/16/24 0411 04/16/24 1052 04/17/24 0247  NA 138 139 135  --   --  137  K 4.0 4.0 4.7  --   --  3.9  CL 101 98 102  --   --  101  CO2 27  --  24  --   --  28  GLUCOSE 157* 161* 169*  --   --  115*  BUN 12 12 12   --   --  11  CREATININE 0.90 0.80 0.82  --   --  0.74  CALCIUM 8.9  --  8.2*  --   --  8.6*  MG 1.5*  --   --  1.7 1.8  2.0  PROT 6.4*  --  5.8*  --   --   --   ALBUMIN 3.5  --  3.3*  --   --   --   AST 27  --  29  --   --   --   ALT 22  --  19  --   --   --   ALKPHOS 59  --  54  --   --   --   BILITOT 1.5*  --  0.7  --   --   --   GFRNONAA >60  --  >60  --   --  >60  ANIONGAP 10  --  9  --   --  8    Lipids No results for input(s): "CHOL", "TRIG", "HDL", "LABVLDL", "LDLCALC", "CHOLHDL" in the last 168 hours.  Hematology Recent Labs  Lab 04/15/24 1818 04/15/24 1839 04/16/24 0234 04/16/24 0411 04/17/24 0247  WBC 8.7  --   --  3.7* 4.6  RBC 3.36*  --  3.02* 3.05* 3.14*  HGB 9.3* 11.9*  --  8.4* 8.6*  HCT 29.0* 35.0*  --  26.3* 26.5*  MCV 86.3  --   --  86.2 84.4  MCH 27.7  --   --  27.5 27.4  MCHC 32.1  --   --  31.9 32.5  RDW 37.2*  --   --  38.2* 37.3*  PLT 388  --   --  329 358   Thyroid  Recent Labs  Lab 04/16/24 0234  TSH 0.552    BNP Recent Labs  Lab 04/15/24 1818  BNP 203.1*    DDimer No results for input(s): "DDIMER" in the last 168 hours.   Radiology    ECHOCARDIOGRAM COMPLETE Result Date: 04/16/2024    ECHOCARDIOGRAM REPORT   Patient Name:   Vincent Marquez Dobberstein Date of Exam: 04/16/2024 Medical Rec #:  [id number]     Height:        67.0 in Accession #:    [id number]    Weight:       175.7 lb Date of Birth:  Feb 17, 1951     BSA:          1.914 m Patient Age:    73 years      BP:           131/92 mmHg Patient Gender: M             HR:           76 bpm. Exam Location:  Inpatient Procedure: 2D Echo, Cardiac Doppler and Color Doppler (Both Spectral and Color            Flow Doppler were utilized during procedure). Indications:    Atrial Fibrillation  History:        Patient has no prior history of Echocardiogram examinations.  Sonographer:    Janette Medley Referring Phys: [id number] ARSHAD N KAKRAKANDY IMPRESSIONS  1. Left ventricular ejection fraction, by estimation, is 60 to 65%. The left ventricle has normal function. The left ventricle has no regional wall motion abnormalities. Left ventricular diastolic parameters are consistent with Grade II diastolic dysfunction (pseudonormalization).  2. Right ventricular systolic function is normal. The right ventricular size is normal. There is normal pulmonary artery systolic pressure. The estimated right ventricular systolic pressure is 27.8 mmHg.  3. The mitral valve is normal in structure. Trivial mitral valve regurgitation. No evidence of mitral stenosis.  4. The aortic valve is normal in structure. Aortic  valve regurgitation is not visualized. No aortic stenosis is present.  5. The inferior vena cava is dilated in size with <50% respiratory variability, suggesting right atrial pressure of 15 mmHg. FINDINGS  Left Ventricle: Left ventricular ejection fraction, by estimation, is 60 to 65%. The left ventricle has normal function. The left ventricle has no regional wall motion abnormalities. The left ventricular internal cavity size was normal in size. There is  no left ventricular hypertrophy. Left ventricular diastolic parameters are consistent with Grade II diastolic dysfunction (pseudonormalization). Right Ventricle: The right ventricular size is normal. No increase in right ventricular wall thickness. Right  ventricular systolic function is normal. There is normal pulmonary artery systolic pressure. The tricuspid regurgitant velocity is 1.79 m/s, and  with an assumed right atrial pressure of 15 mmHg, the estimated right ventricular systolic pressure is 27.8 mmHg. Left Atrium: Left atrial size was normal in size. Right Atrium: Right atrial size was normal in size. Pericardium: There is no evidence of pericardial effusion. Mitral Valve: The mitral valve is normal in structure. Trivial mitral valve regurgitation. No evidence of mitral valve stenosis. Tricuspid Valve: The tricuspid valve is normal in structure. Tricuspid valve regurgitation is not demonstrated. No evidence of tricuspid stenosis. Aortic Valve: The aortic valve is normal in structure. Aortic valve regurgitation is not visualized. No aortic stenosis is present. Pulmonic Valve: The pulmonic valve was normal in structure. Pulmonic valve regurgitation is not visualized. No evidence of pulmonic stenosis. Aorta: The aortic root is normal in size and structure. Venous: The inferior vena cava is dilated in size with less than 50% respiratory variability, suggesting right atrial pressure of 15 mmHg. IAS/Shunts: No atrial level shunt detected by color flow Doppler.  LEFT VENTRICLE PLAX 2D LVIDd:         4.80 cm   Diastology LVIDs:         2.90 cm   LV e' medial:    9.17 cm/s LV PW:         1.00 cm   LV E/e' medial:  10.0 LV IVS:        0.80 cm   LV e' lateral:   8.55 cm/s LVOT diam:     2.10 cm   LV E/e' lateral: 10.7 LV SV:         73 LV SV Index:   38 LVOT Area:     3.46 cm  RIGHT VENTRICLE             IVC RV S prime:     16.00 cm/s  IVC diam: 2.80 cm TAPSE (M-mode): 2.4 cm LEFT ATRIUM             Index        RIGHT ATRIUM           Index LA Vol (A2C):   51.9 ml 27.12 ml/m  RA Area:     19.60 cm LA Vol (A4C):   43.4 ml 22.68 ml/m  RA Volume:   53.10 ml  27.74 ml/m LA Biplane Vol: 50.6 ml 26.44 ml/m  AORTIC VALVE LVOT Vmax:   113.00 cm/s LVOT Vmean:  71.100 cm/s  LVOT VTI:    0.212 m  AORTA Ao Root diam: 3.40 cm Ao Asc diam:  3.50 cm MITRAL VALVE               TRICUSPID VALVE MV Area (PHT): 4.31 cm    TR Peak grad:   12.8 mmHg MV Decel Time: 176 msec    TR  Vmax:        179.00 cm/s MV E velocity: 91.50 cm/s MV A velocity: 62.80 cm/s  SHUNTS MV E/A ratio:  1.46        Systemic VTI:  0.21 m                            Systemic Diam: 2.10 cm Dorothye Gathers MD Electronically signed by Dorothye Gathers MD Signature Date/Time: 04/16/2024/3:48:09 PM    Final    US  Abdomen Limited RUQ (LIVER/GB) Result Date: 04/16/2024 CLINICAL DATA:  Gallstone. EXAM: ULTRASOUND ABDOMEN LIMITED RIGHT UPPER QUADRANT COMPARISON:  CT AP from 04/15/2024 FINDINGS: Gallbladder: No gallstones or gallbladder wall thickening. Equivocal for sludge within the dependent portion of the gallbladder. No pericholecystic fluid. Common bile duct: Diameter: 2.2 mm Liver: No focal lesion identified. Within normal limits in parenchymal echogenicity. Portal vein is patent on color Doppler imaging with normal direction of blood flow towards the liver. Other: None. IMPRESSION: 1. No gallstones or gallbladder wall thickening. 2. Equivocal for sludge within the dependent portion of the gallbladder. Electronically Signed   By: Kimberley Penman M.D.   On: 04/16/2024 05:38   CT ABDOMEN PELVIS WO CONTRAST Result Date: 04/16/2024 CLINICAL DATA:  Acute nonlocalized abdominal pain EXAM: CT ABDOMEN AND PELVIS WITHOUT CONTRAST TECHNIQUE: Multidetector CT imaging of the abdomen and pelvis was performed following the standard protocol without IV contrast. RADIATION DOSE REDUCTION: This exam was performed according to the departmental dose-optimization program which includes automated exposure control, adjustment of the mA and/or kV according to patient size and/or use of iterative reconstruction technique. COMPARISON:  MRI abdomen 11/15/2021 and CT abdomen 10/17/2021 FINDINGS: Lower chest: No acute abnormality. Hepatobiliary: Layering sludge in  the gallbladder. No evidence of cholecystitis. No biliary dilation. Unremarkable noncontrast appearance of the liver. Pancreas: Unremarkable. Spleen: Unremarkable. Adrenals/Urinary Tract: Normal adrenal glands. No urinary calculi or hydronephrosis. Unremarkable bladder. Stomach/Bowel: No bowel obstruction or bowel wall thickening. Colonic diverticulosis without diverticulitis. Stomach is within normal limits. Appendix is normal. Vascular/Lymphatic: Advanced aortic atherosclerotic calcification. No lymphadenopathy. Reproductive: Prostate is unremarkable. Large left and moderate right hydroceles Other: No free intraperitoneal fluid or air. Musculoskeletal: No acute fracture. IMPRESSION: 1. Large left and moderate right hydroceles. 2. Cholelithiasis. 3. Aortic Atherosclerosis (ICD10-I70.0). Electronically Signed   By: Rozell Cornet M.D.   On: 04/16/2024 00:01   DG Chest Portable 1 View Result Date: 04/15/2024 CLINICAL DATA:  Respiratory distress. EXAM: PORTABLE CHEST 1 VIEW COMPARISON:  Radiographs 01/15/2019 and 03/27/2013.  CT 10/01/2023. FINDINGS: 1824 hours. The heart size and mediastinal contours are stable. Lower lung volumes with new patchy left basilar airspace disease. The right lung appears clear. No evidence of pneumothorax or significant pleural effusion. The bones appear unremarkable. IMPRESSION: New left basilar atelectasis or early infiltrate.  No edema. Electronically Signed   By: Elmon Hagedorn M.D.   On: 04/15/2024 18:42    Cardiac Studies   Echo pending  Patient Profile     73 y.o. male with hx of COPD found to be in new onset Afib RVR.   Assessment & Plan    Atrial fibrillation with RVR with hypotension Remain in NSR . Rate is well controlled.  Change IV amio to PO    Continue eliquis .   Echocardiogram reveals normal left ventricular systolic function.  He has grade 2 diastolic dysfunction.  Trivial mitral regurgitation.  I would continue Eliquis.  He may only need  amiodarone for a brief  time.  He is a longtime smoker and we will need to watch for any development of pulmonary toxicity if he stays on amiodarone.    SOB He still has significant wheezing.  I suspect he has COPD.  I have advised him to stop smoking.  On presentation there was some clinical suspicion for pneumonia.   [facility] will sign off.   Medication Recommendations:  continue eliquis.  He will take amiodarone 200 mg BID for 7 days then reduce dose to 200 mg every day .  Other recommendations (labs, testing, etc):   Follow up as an outpatient:  with Dr. Maximo Spar          For questions or updates, please contact [facility] Please consult [email or web address] for contact info under         Patrici Boom., MD, [facility] 04/17/2024, 8:51 AM [address]  [address] Office [phone] Pager [phone]

## 2024-04-17 NOTE — TOC Transition Note (Signed)
 Transition of Care [facility]) - Discharge Note   Patient Details  Name: Vincent Marquez MRN: [id number] Date of Birth: Jul 24, 1951  Transition [facility] Care [facility]) CM/SW Contact:  Jennett Model, RN Phone Number: 04/17/2024, 9:29 AM   Clinical Narrative:    For dc today, will be on eliquis, copay is zero dollars per pharmacy check.          Patient Goals and CMS Choice            Discharge Placement                       Discharge Plan and Services Additional resources added to the After Visit Summary for                                       Social Drivers of Health (SDOH) Interventions SDOH Screenings   Food Insecurity: No Food Insecurity (04/16/2024)  Housing: Low Risk  (04/16/2024)  Transportation Needs: No Transportation Needs (04/16/2024)  Utilities: Not At Risk (04/16/2024)  Social Connections: Patient Declined (04/16/2024)  Tobacco Use: High Risk (04/15/2024)     Readmission Risk Interventions     No data to display

## 2024-04-17 NOTE — Plan of Care (Signed)
  Problem: Education: Goal: Knowledge of General Education information will improve Description: Including pain rating scale, medication(s)/side effects and non-pharmacologic comfort measures Outcome: Progressing   Problem: Clinical Measurements: Goal: Will remain free from infection Outcome: Progressing Goal: Diagnostic test results will improve Outcome: Progressing Goal: Respiratory complications will improve Outcome: Progressing   Problem: Activity: Goal: Risk for activity intolerance will decrease Outcome: Progressing   Problem: Nutrition: Goal: Adequate nutrition will be maintained Outcome: Progressing   Problem: Pain Managment: Goal: General experience of comfort will improve and/or be controlled Outcome: Progressing   Problem: Safety: Goal: Ability to remain free from injury will improve Outcome: Progressing

## 2024-04-17 NOTE — Discharge Instructions (Signed)

## 2024-04-17 NOTE — Discharge Summary (Signed)
 Physician Discharge Summary  AAKASH KOSIOR ZOX:[id number] DOB: 14-Jul-1951 DOA: 04/15/2024  PCP: Vincent Gander, MD  Admit date: 04/15/2024 Discharge date: 04/17/24  Admitted From: Home Disposition: Home Recommendations for Outpatient Follow-up:  Follow up with PCP in 1 week Cardiology to arrange outpatient follow-up Check blood pressure, CBC and CMP at follow-up Please follow up on the following pending results: None  Home Health: No need identified Equipment/Devices: No need identified  Discharge Condition: Stable CODE STATUS: Full code  Follow-up Information     Vincent Gander, MD. Schedule an appointment as soon as possible for a visit in 1 week(s).   Specialty: Family Medicine Why: May 01, 2024 @3 :20 PM Pick Time @ 3PM Contact information: [address] 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    In ED, in RVR with HR to 180s.  Tachypneic to 29.  Slightly hypotensive. CXR with left basilar atelectasis or early infiltrate.  Serial troponin negative.  BNP 203.  Cardiology consulted.  Started on amiodarone and heparin drip, and admitted.  Also started on ceftriaxone doxycycline  for possible pneumonia.   Patient converted to normal sinus rhythm overnight.  Remains on amiodarone drip per cardiology.  Antibiotic discontinued.  Pneumonia rule out.  TTE with normal LVEF but G2-DD.  On the day of discharge, remained in sinus rhythm and asymptomatic.  Cleared for discharge on p.o. amiodarone 200 mg twice daily for 1 week followed by 200 mg daily, and p.o. Eliquis for anticoagulation.  Patient has been counseled on the importance of alcohol, tobacco and marijuana cessation.  Also advised to avoid NSAID while taking  Eliquis.  Cardiology to arrange outpatient follow-up.  See individual problem list below for more.   Problems addressed during this hospitalization New onset A-fib with RVR: Unclear etiology of this.  TTE with LVEF of 60 to 65%, G2 DD.  TSH normal.  Denies heavy drinking or recreational drug use.  Started on amiodarone drip and converted to normal sinus rhythm.  Remained in sinus rhythm. - Discharged on amiodarone and Eliquis.   HFpEF: TTE as above.  Appears euvolemic on exam.  Currently no cardiopulmonary symptoms.   Chronic abdominal discomfort: CT abdomen and pelvis suggested cholelithiasis and large left and moderate right hydroceles.  RUQ US  ordered and negative for cholelithiasis but possible sludge.  He has mild epigastric tenderness that he attributes to not eating. He was n.p.o. since admission.  Denies melena or hematochezia.  Has history of gastritis -P.o. Protonix  40 mg daily   Anemia/hemosiderosis: Baseline Hgb seems to be about 9.  Slight drop from admission likely hemodilution.  No overt bleeding.  Iron level 210, saturation 92% and ferritin 865.  Stable. Recent Labs    01/29/24 0851 04/15/24 1818 04/15/24 1839 04/16/24 0411 04/17/24 0247  HGB 9.4* 9.3* 11.9* 8.4* 8.6*  - Repeat CBC at follow-up - Outpatient follow-up with hematologist as previously planned    Pneumonia ruled out.  No fever or significant respiratory symptoms.  Dyspnea on presentation likely due to A-fib with RVR.  No significant infiltrate on checks x-ray and CT abdomen.  Pro-Cal negative.  No cardiopulmonary symptoms, fever or leukocytosis   Lactic acidosis: Likely due to hypotension in  the setting of A-fib with RVR.  Resolved.   Chronic COPD: Stable -Continue Trelegy Ellipta or hospital formulary -Encouraged smoking cessation   Tobacco use disorder: Reports smoking a pack a day. -Encouraged smoking cessation -Nicotine patch   Hyperglycemia: Not diabetic.  A1c 5.5%. -Monitor with daily    GERD - PPI   Hypogonadism - Outpatient follow-up            Time spent 35 minutes  Vital signs Vitals:   04/16/24 2038 04/17/24 0000 04/17/24 0421 04/17/24 0717  BP: 137/85 130/73 137/70 (!) 155/94  Pulse: 70 77 77   Temp: 98.1 F (36.7 C) 98.3 F (36.8 C) 97.8 F (36.6 C) 97.8 F (36.6 C)  Resp: 18 18 19  (!) 23  Height:      Weight:      SpO2: 94% 92% 92% 98%  TempSrc: Oral Oral Oral Oral  BMI (Calculated):         Discharge exam  GENERAL: No apparent distress.  Nontoxic. HEENT: MMM.  Vision and hearing grossly intact.  NECK: Supple.  No apparent JVD.  RESP:  No IWOB.  Fair aeration bilaterally. CVS:  RRR. Heart sounds normal.  ABD/GI/GU: BS+. Abd soft, NTND.  MSK/EXT:  Moves extremities. No apparent deformity. No edema.  SKIN: no apparent skin lesion or wound NEURO: Awake and alert. Oriented appropriately.  No apparent focal neuro deficit. PSYCH: Calm. Normal affect.   Discharge Instructions Discharge Instructions     Diet - low sodium heart healthy   Complete by: As directed    Discharge instructions   Complete by: As directed    It has been a pleasure taking care of you!  You were hospitalized due to atrial fibrillation (irregular and fast heart rate) for which you have been treated.  We are discharging you on medications to regulate your heart rhythm and rate and also blood thinner to prevent stroke.  Take your medications as prescribed.  Please review your new medication list and the directions on your medications before you take them.  Avoid any over-the-counter pain medication other than plain Tylenol  while taking Eliquis.  We also recommend you stop smoking marijuana and cigarettes.  Alcohol can increase your risk of atrial fibrillation.  It is important that you quit smoking cigarettes.  You may use nicotine patch to help you quit smoking.  Nicotine patch is available over-the-counter.  You may also discuss other options to help you quit smoking  with your primary care doctor. You can also talk to professional counselors at 1-800-QUIT-NOW [phone]) for free smoking cessation counseling.        Take care,   Increase activity slowly   Complete by: As directed       Allergies as of 04/17/2024   No Known Allergies      Medication List     STOP taking these medications    naproxen  375 MG tablet Commonly known as: NAPROSYN    omeprazole 20 MG capsule Commonly known as: PRILOSEC       TAKE these medications    albuterol  108 (90 Base) MCG/ACT inhaler Commonly known as: VENTOLIN  HFA Inhale 1-2 puffs into the lungs every 6 (six) hours as needed for wheezing or shortness of breath.   amiodarone 200 MG tablet Commonly known as: PACERONE Take 1 tablet (200 mg total) by mouth 2 (two) times daily for 7 days, THEN 1 tablet (200 mg total) daily. Start taking on: Apr 17, 2024   Eliquis 5 MG Tabs tablet Generic drug:  apixaban Take 1 tablet (5 mg total) by mouth 2 (two) times daily.   Fluticasone-Umeclidin-Vilant 100-62.5-25 MCG/INH Aepb Inhale into the lungs.   nicotine 21 mg/24hr patch Commonly known as: NICODERM CQ - dosed in mg/24 hours Place 1 patch (21 mg total) onto the skin daily.   orphenadrine  100 MG tablet Commonly known as: NORFLEX  Take 1 tablet (100 mg total) by mouth 2 (two) times daily.   pantoprazole  40 MG tablet Commonly known as: Protonix  Take 1 tablet (40 mg total) by mouth daily.   sildenafil 50 MG tablet Commonly known as: VIAGRA Take 50 mg by mouth daily as needed for erectile dysfunction.        Consultations: Cardiology  Procedures/Studies:   ECHOCARDIOGRAM COMPLETE Result Date: 04/16/2024    ECHOCARDIOGRAM REPORT   Patient Name:   Vincent Marquez Fountain Date of Exam: 04/16/2024 Medical Rec #:  [id number]     Height:       67.0 in Accession #:    [id number]    Weight:       175.7 lb Date of Birth:  1951-01-15     BSA:          1.914 m Patient Age:    73 years      BP:           131/92  mmHg Patient Gender: M             HR:           76 bpm. Exam Location:  Inpatient Procedure: 2D Echo, Cardiac Doppler and Color Doppler (Both Spectral and Color            Flow Doppler were utilized during procedure). Indications:    Atrial Fibrillation  History:        Patient has no prior history of Echocardiogram examinations.  Sonographer:    Janette Medley Referring Phys: [id number] ARSHAD N KAKRAKANDY IMPRESSIONS  1. Left ventricular ejection fraction, by estimation, is 60 to 65%. The left ventricle has normal function. The left ventricle has no regional wall motion abnormalities. Left ventricular diastolic parameters are consistent with Grade II diastolic dysfunction (pseudonormalization).  2. Right ventricular systolic function is normal. The right ventricular size is normal. There is normal pulmonary artery systolic pressure. The estimated right ventricular systolic pressure is 27.8 mmHg.  3. The mitral valve is normal in structure. Trivial mitral valve regurgitation. No evidence of mitral stenosis.  4. The aortic valve is normal in structure. Aortic valve regurgitation is not visualized. No aortic stenosis is present.  5. The inferior vena cava is dilated in size with <50% respiratory variability, suggesting right atrial pressure of 15 mmHg. FINDINGS  Left Ventricle: Left ventricular ejection fraction, by estimation, is 60 to 65%. The left ventricle has normal function. The left ventricle has no regional wall motion abnormalities. The left ventricular internal cavity size was normal in size. There is  no left ventricular hypertrophy. Left ventricular diastolic parameters are consistent with Grade II diastolic dysfunction (pseudonormalization). Right Ventricle: The right ventricular size is normal. No increase in right ventricular wall thickness. Right ventricular systolic function is normal. There is normal pulmonary artery systolic pressure. The tricuspid regurgitant velocity is 1.79 m/s, and  with an assumed  right atrial pressure of 15 mmHg, the estimated right ventricular systolic pressure is 27.8 mmHg. Left Atrium: Left atrial size was normal in size. Right Atrium: Right atrial size was normal in size. Pericardium: There is no evidence of pericardial effusion. Mitral Valve: The mitral  valve is normal in structure. Trivial mitral valve regurgitation. No evidence of mitral valve stenosis. Tricuspid Valve: The tricuspid valve is normal in structure. Tricuspid valve regurgitation is not demonstrated. No evidence of tricuspid stenosis. Aortic Valve: The aortic valve is normal in structure. Aortic valve regurgitation is not visualized. No aortic stenosis is present. Pulmonic Valve: The pulmonic valve was normal in structure. Pulmonic valve regurgitation is not visualized. No evidence of pulmonic stenosis. Aorta: The aortic root is normal in size and structure. Venous: The inferior vena cava is dilated in size with less than 50% respiratory variability, suggesting right atrial pressure of 15 mmHg. IAS/Shunts: No atrial level shunt detected by color flow Doppler.  LEFT VENTRICLE PLAX 2D LVIDd:         4.80 cm   Diastology LVIDs:         2.90 cm   LV e' medial:    9.17 cm/s LV PW:         1.00 cm   LV E/e' medial:  10.0 LV IVS:        0.80 cm   LV e' lateral:   8.55 cm/s LVOT diam:     2.10 cm   LV E/e' lateral: 10.7 LV SV:         73 LV SV Index:   38 LVOT Area:     3.46 cm  RIGHT VENTRICLE             IVC RV S prime:     16.00 cm/s  IVC diam: 2.80 cm TAPSE (M-mode): 2.4 cm LEFT ATRIUM             Index        RIGHT ATRIUM           Index LA Vol (A2C):   51.9 ml 27.12 ml/m  RA Area:     19.60 cm LA Vol (A4C):   43.4 ml 22.68 ml/m  RA Volume:   53.10 ml  27.74 ml/m LA Biplane Vol: 50.6 ml 26.44 ml/m  AORTIC VALVE LVOT Vmax:   113.00 cm/s LVOT Vmean:  71.100 cm/s LVOT VTI:    0.212 m  AORTA Ao Root diam: 3.40 cm Ao Asc diam:  3.50 cm MITRAL VALVE               TRICUSPID VALVE MV Area (PHT): 4.31 cm    TR Peak grad:    12.8 mmHg MV Decel Time: 176 msec    TR Vmax:        179.00 cm/s MV E velocity: 91.50 cm/s MV A velocity: 62.80 cm/s  SHUNTS MV E/A ratio:  1.46        Systemic VTI:  0.21 m                            Systemic Diam: 2.10 cm Dorothye Gathers MD Electronically signed by Dorothye Gathers MD Signature Date/Time: 04/16/2024/3:48:09 PM    Final    US  Abdomen Limited RUQ (LIVER/GB) Result Date: 04/16/2024 CLINICAL DATA:  Gallstone. EXAM: ULTRASOUND ABDOMEN LIMITED RIGHT UPPER QUADRANT COMPARISON:  CT AP from 04/15/2024 FINDINGS: Gallbladder: No gallstones or gallbladder wall thickening. Equivocal for sludge within the dependent portion of the gallbladder. No pericholecystic fluid. Common bile duct: Diameter: 2.2 mm Liver: No focal lesion identified. Within normal limits in parenchymal echogenicity. Portal vein is patent on color Doppler imaging with normal direction of blood flow towards the liver. Other: None. IMPRESSION: 1. No  gallstones or gallbladder wall thickening. 2. Equivocal for sludge within the dependent portion of the gallbladder. Electronically Signed   By: Kimberley Penman M.D.   On: 04/16/2024 05:38   CT ABDOMEN PELVIS WO CONTRAST Result Date: 04/16/2024 CLINICAL DATA:  Acute nonlocalized abdominal pain EXAM: CT ABDOMEN AND PELVIS WITHOUT CONTRAST TECHNIQUE: Multidetector CT imaging of the abdomen and pelvis was performed following the standard protocol without IV contrast. RADIATION DOSE REDUCTION: This exam was performed according to the departmental dose-optimization program which includes automated exposure control, adjustment of the mA and/or kV according to patient size and/or use of iterative reconstruction technique. COMPARISON:  MRI abdomen 11/15/2021 and CT abdomen 10/17/2021 FINDINGS: Lower chest: No acute abnormality. Hepatobiliary: Layering sludge in the gallbladder. No evidence of cholecystitis. No biliary dilation. Unremarkable noncontrast appearance of the liver. Pancreas: Unremarkable. Spleen:  Unremarkable. Adrenals/Urinary Tract: Normal adrenal glands. No urinary calculi or hydronephrosis. Unremarkable bladder. Stomach/Bowel: No bowel obstruction or bowel wall thickening. Colonic diverticulosis without diverticulitis. Stomach is within normal limits. Appendix is normal. Vascular/Lymphatic: Advanced aortic atherosclerotic calcification. No lymphadenopathy. Reproductive: Prostate is unremarkable. Large left and moderate right hydroceles Other: No free intraperitoneal fluid or air. Musculoskeletal: No acute fracture. IMPRESSION: 1. Large left and moderate right hydroceles. 2. Cholelithiasis. 3. Aortic Atherosclerosis (ICD10-I70.0). Electronically Signed   By: Rozell Cornet M.D.   On: 04/16/2024 00:01   DG Chest Portable 1 View Result Date: 04/15/2024 CLINICAL DATA:  Respiratory distress. EXAM: PORTABLE CHEST 1 VIEW COMPARISON:  Radiographs 01/15/2019 and 03/27/2013.  CT 10/01/2023. FINDINGS: 1824 hours. The heart size and mediastinal contours are stable. Lower lung volumes with new patchy left basilar airspace disease. The right lung appears clear. No evidence of pneumothorax or significant pleural effusion. The bones appear unremarkable. IMPRESSION: New left basilar atelectasis or early infiltrate.  No edema. Electronically Signed   By: Elmon Hagedorn M.D.   On: 04/15/2024 18:42       The results of significant diagnostics from this hospitalization (including imaging, microbiology, ancillary and laboratory) are listed below for reference.     Microbiology: Recent Results (from the past 240 hours)  Resp panel by RT-PCR (RSV, Flu A&B, Covid) Anterior Nasal Swab     Status: None   Collection Time: 04/15/24  6:18 PM   Specimen: Anterior Nasal Swab  Result Value Ref Range Status   SARS Coronavirus 2 by RT PCR NEGATIVE NEGATIVE Final   Influenza A by PCR NEGATIVE NEGATIVE Final   Influenza B by PCR NEGATIVE NEGATIVE Final    Comment: (NOTE) The Xpert Xpress SARS-CoV-2/FLU/RSV plus assay  is intended as an aid in the diagnosis of influenza from Nasopharyngeal swab specimens and should not be used as a sole basis for treatment. Nasal washings and aspirates are unacceptable for Xpert Xpress SARS-CoV-2/FLU/RSV testing.  Fact Sheet for Patients: BloggerCourse.com  Fact Sheet for Healthcare Providers: SeriousBroker.it  This test is not yet approved or cleared by the [place]  FDA and has been authorized for detection and/or diagnosis of SARS-CoV-2 by FDA under an Emergency Use Authorization (EUA). This EUA will remain in effect (meaning this test can be used) for the duration of the COVID-19 declaration under Section 564(b)(1) of the Act, 21 U.S.C. section 360bbb-3(b)(1), unless the authorization is terminated or revoked.     Resp Syncytial Virus by PCR NEGATIVE NEGATIVE Final    Comment: (NOTE) Fact Sheet for Patients: BloggerCourse.com  Fact Sheet for Healthcare Providers: SeriousBroker.it  This test is not yet approved or cleared by the [place]   FDA and has been authorized for detection and/or diagnosis of SARS-CoV-2 by FDA under an Emergency Use Authorization (EUA). This EUA will remain in effect (meaning this test can be used) for the duration of the COVID-19 declaration under Section 564(b)(1) of the Act, 21 U.S.C. section 360bbb-3(b)(1), unless the authorization is terminated or revoked.  Performed at [facility], [address], [place], [place] [address]   Culture, blood (routine x 2)     Status: None (Preliminary result)   Collection Time: 04/15/24  7:35 PM   Specimen: BLOOD  Result Value Ref Range Status   Specimen Description BLOOD SITE NOT SPECIFIED  Final   Special Requests   Final    BOTTLES DRAWN AEROBIC AND ANAEROBIC Blood Culture results may not be optimal due to an inadequate volume of blood received in culture bottles   Culture    Final    NO GROWTH 2 DAYS Performed at [facility], [address] [place], [place] [address]    Report Status PENDING  Incomplete  Culture, blood (routine x 2)     Status: None (Preliminary result)   Collection Time: 04/15/24  7:40 PM   Specimen: BLOOD  Result Value Ref Range Status   Specimen Description BLOOD SITE NOT SPECIFIED  Final   Special Requests   Final    BOTTLES DRAWN AEROBIC AND ANAEROBIC Blood Culture results may not be optimal due to an inadequate volume of blood received in culture bottles   Culture   Final    NO GROWTH 2 DAYS Performed at [facility], [address], [place], [place] [address]    Report Status PENDING  Incomplete     Labs:  CBC: Recent Labs  Lab 04/15/24 1818 04/15/24 1839 04/16/24 0411 04/17/24 0247  WBC 8.7  --  3.7* 4.6  NEUTROABS 2.1  --   --   --   HGB 9.3* 11.9* 8.4* 8.6*  HCT 29.0* 35.0* 26.3* 26.5*  MCV 86.3  --  86.2 84.4  PLT 388  --  329 358   BMP &GFR Recent Labs  Lab 04/15/24 1818 04/15/24 1839 04/16/24 0234 04/16/24 0411 04/16/24 1052 04/17/24 0247  NA 138 139 135  --   --  137  K 4.0 4.0 4.7  --   --  3.9  CL 101 98 102  --   --  101  CO2 27  --  24  --   --  28  GLUCOSE 157* 161* 169*  --   --  115*  BUN 12 12 12   --   --  11  CREATININE 0.90 0.80 0.82  --   --  0.74  CALCIUM 8.9  --  8.2*  --   --  8.6*  MG 1.5*  --   --  1.7 1.8 2.0   Estimated Creatinine Clearance: 85.7 mL/min (by C-G formula based on SCr of 0.74 mg/dL). Liver & Pancreas: Recent Labs  Lab 04/15/24 1818 04/16/24 0234  AST 27 29  ALT 22 19  ALKPHOS 59 54  BILITOT 1.5* 0.7  PROT 6.4* 5.8*  ALBUMIN 3.5 3.3*   Recent Labs  Lab 04/15/24 1818  LIPASE 25   No results for input(s): "AMMONIA" in the last 168 hours. Diabetic: Recent Labs    04/16/24 0410  HGBA1C 5.5   No results for input(s): "GLUCAP" in the last 168 hours. Cardiac Enzymes: No results for input(s): "CKTOTAL", "CKMB", "CKMBINDEX", "TROPONINI" in  the last 168  hours. No results for input(s): "PROBNP" in the last 8760 hours. Coagulation Profile: No results for input(s): "INR", "PROTIME" in the last 168 hours. Thyroid Function Tests: Recent Labs    04/16/24 0234  TSH 0.552   Lipid Profile: No results for input(s): "CHOL", "HDL", "LDLCALC", "TRIG", "CHOLHDL", "LDLDIRECT" in the last 72 hours. Anemia Panel: Recent Labs    04/16/24 0234  VITAMINB12 378  FOLATE 6.5  FERRITIN 865*  TIBC 230*  IRON 210*  RETICCTPCT <0.4*   Urine analysis:    Component Value Date/Time   COLORURINE YELLOW 01/09/2018 1134   APPEARANCEUR CLEAR 01/09/2018 1134   LABSPEC 1.020 01/09/2018 1134   PHURINE 5.0 01/09/2018 1134   GLUCOSEU NEGATIVE 01/09/2018 1134   HGBUR NEGATIVE 01/09/2018 1134   BILIRUBINUR NEGATIVE 01/09/2018 1134   KETONESUR NEGATIVE 01/09/2018 1134   PROTEINUR NEGATIVE 01/09/2018 1134   UROBILINOGEN 0.2 01/29/2009 1713   NITRITE NEGATIVE 01/09/2018 1134   LEUKOCYTESUR NEGATIVE 01/09/2018 1134   Sepsis Labs: Invalid input(s): "PROCALCITONIN", "LACTICIDVEN"   SIGNED:  Theadore Finger, MD  [facility] 04/17/2024, 3:50 PM

## 2024-04-20 LAB — CULTURE, BLOOD (ROUTINE X 2)
Culture: NO GROWTH
Culture: NO GROWTH

## 2024-05-06 NOTE — Progress Notes (Unsigned)
 Cardiology Clinic Note   Patient Name: SAAFIR ABDULLAH Date of Encounter: 05/07/2024  Primary Care Provider:  Ulysees Gander, MD Primary Cardiologist:  Hazle Lites, MD  Patient Profile    SHASHWAT CLEARY 73 year old male presents the clinic today for follow-up evaluation of his atrial fibrillation.  Past Medical History    Past Medical History:  Diagnosis Date   Anemia    COPD (chronic obstructive pulmonary disease) (HCC)    GERD (gastroesophageal reflux disease)    Hypertension    Past Surgical History:  Procedure Laterality Date   COLONOSCOPY     MULTIPLE TOOTH EXTRACTIONS      Allergies  No Known Allergies  History of Present Illness    Eean L Amiri has a PMH of atrial fibrillation with RVR, COPD, hypomagnesemia, hyperglycemia, anemia, GERD, tobacco use disorder, gastritis, and shortness of breath.  He was admitted to the hospital on 04/16/2024 and discharged on 04/17/2024.  His heart rate was noted to be in the 180s.  He was tachypneic with breath rates of 29.  He was noted to be slightly hypotensive.  His chest x-ray showed left basilar atelectasis or early infiltrates.  His serial troponins were negative.  His BNP was 203.  Cardiology was consulted.  He was started on amiodarone  and heparin  GTT.  He received cefazolin, doxycycline  for possible pneumonia.  He converted to sinus rhythm.  His antibiotics were discontinued.  Pneumonia was ruled out.  He underwent TTE which showed normal LVEF and G2 DD.  At the time of discharge he remained in sinus rhythm and was asymptomatic.  He was transitioned to amiodarone  p.o.  Plan was to continue 200 mg twice daily for 1 week followed by 200 mg daily and p.o. apixaban .  He was counseled on cessation of alcohol, tobacco, and marijuana.  He was also instructed to avoid NSAIDs.  He presents to the clinic today for follow-up evaluation states he has been doing well.  He denies further episodes of accelerated or irregular heart rate.   He reports that he has been taking his amiodarone  200 mg twice daily.  His EKG today shows a QTc prolongation of 539.  Case was reviewed with DOD.  We will stop his amiodarone  at this time.  I will order a 2-week cardiac event monitor and order a nuclear stress test for further prognostication.  We will have him continue his apixaban  and plan follow-up with Dr. Rolm Clos in 3 to 4 months..  Today he denies chest pain, shortness of breath, lower extremity edema, fatigue, palpitations, melena, hematuria, hemoptysis, diaphoresis, weakness, presyncope, syncope, orthopnea, and PND.    Home Medications    Prior to Admission medications   Medication Sig Start Date End Date Taking? Authorizing Provider  albuterol  (PROVENTIL  HFA;VENTOLIN  HFA) 108 (90 Base) MCG/ACT inhaler Inhale 1-2 puffs into the lungs every 6 (six) hours as needed for wheezing or shortness of breath. 01/15/19   Mortenson, Ashley, MD  amiodarone  (PACERONE ) 200 MG tablet Take 1 tablet (200 mg total) by mouth 2 (two) times daily for 7 days, THEN 1 tablet (200 mg total) daily. 04/17/24 07/16/24  Gonfa, Taye T, MD  apixaban  (ELIQUIS ) 5 MG TABS tablet Take 1 tablet (5 mg total) by mouth 2 (two) times daily. 04/17/24   Gonfa, Taye T, MD  Fluticasone -Umeclidin-Vilant 100-62.5-25 MCG/INH AEPB Inhale into the lungs.    [provider]  nicotine  (NICODERM CQ  - DOSED IN MG/24 HOURS) 21 mg/24hr patch Place 1 patch (  21 mg total) onto the skin daily. 04/17/24   Gonfa, Taye T, MD  orphenadrine  (NORFLEX ) 100 MG tablet Take 1 tablet (100 mg total) by mouth 2 (two) times daily. Patient not taking: Reported on 04/15/2024 10/18/22   Alissa April, MD  pantoprazole  (PROTONIX ) 40 MG tablet Take 1 tablet (40 mg total) by mouth daily. 04/17/24   Gonfa, Taye T, MD  sildenafil (VIAGRA) 50 MG tablet Take 50 mg by mouth daily as needed for erectile dysfunction.    [provider]    Family History    Family History  Problem Relation Age of Onset   Cancer Mother     Cancer Sister        gastric cancer   Other Sister        Unknown stomach issues   Cancer Sister        gastric cancer   Other Sister        Stomach issues, possibly cancer   Colon cancer Neg Hx    Esophageal cancer Neg Hx    Rectal cancer Neg Hx    Stomach cancer Neg Hx    He indicated that his mother is deceased. He indicated that his father is deceased. He indicated that both of his sisters are deceased. He indicated that the status of his neg hx is unknown.  Social History    Social History   Socioeconomic History   Marital status: Single    Spouse name: Not on file   Number of children: 8   Years of education: Not on file   Highest education level: Not on file  Occupational History   Not on file  Tobacco Use   Smoking status: Every Day    Current packs/day: 1.00    Average packs/day: 1 pack/day for 50.0 years (50.0 ttl pk-yrs)    Types: Cigarettes   Smokeless tobacco: Never  Vaping Use   Vaping status: Never Used  Substance and Sexual Activity   Alcohol use: Yes    Comment: weekend drinker for a few beers   Drug use: No   Sexual activity: Not on file  Other Topics Concern   Not on file  Social History Narrative   Corporate investment banker    Social Drivers of Health   Financial Resource Strain: Not on file  Food Insecurity: No Food Insecurity (04/16/2024)   Hunger Vital Sign    Worried About Running Out of Food in the Last Year: Never true    Ran Out of Food in the Last Year: Never true  Transportation Needs: No Transportation Needs (04/16/2024)   PRAPARE - Administrator, Civil Service (Medical): No    Lack of Transportation (Non-Medical): No  Physical Activity: Not on file  Stress: Not on file  Social Connections: Patient Declined (04/16/2024)   Social Connection and Isolation Panel [NHANES]    Frequency of Communication with Friends and Family: Patient declined    Frequency of Social Gatherings with Friends and Family: Patient declined     Attends Religious Services: Patient declined    Database administrator or Organizations: Patient declined    Attends Banker Meetings: Patient declined    Marital Status: Patient declined  Intimate Partner Violence: Not At Risk (04/16/2024)   Humiliation, Afraid, Rape, and Kick questionnaire    Fear of Current or Ex-Partner: No    Emotionally Abused: No    Physically Abused: No    Sexually Abused: No     Review of  Systems    General:  No chills, fever, night sweats or weight changes.  Cardiovascular:  No chest pain, dyspnea on exertion, edema, orthopnea, palpitations, paroxysmal nocturnal dyspnea. Dermatological: No rash, lesions/masses Respiratory: No cough, dyspnea Urologic: No hematuria, dysuria Abdominal:   No nausea, vomiting, diarrhea, bright red blood per rectum, melena, or hematemesis Neurologic:  No visual changes, wkns, changes in mental status. All other systems reviewed and are otherwise negative except as noted above.  Physical Exam    VS:  BP 119/60   Pulse 80   Ht 5\' 6"  (1.676 m)   Wt 177 lb 6.4 oz (80.5 kg)   SpO2 97%   BMI 28.63 kg/m  , BMI Body mass index is 28.63 kg/m. GEN: Well nourished, well developed, in no acute distress. HEENT: normal. Neck: Supple, no JVD, carotid bruits, or masses. Cardiac: RRR, no murmurs, rubs, or gallops. No clubbing, cyanosis, edema.  Radials/DP/PT 2+ and equal bilaterally.  Respiratory:  Respirations regular and unlabored, clear to auscultation bilaterally. GI: Soft, nontender, nondistended, BS + x 4. MS: no deformity or atrophy. Skin: warm and dry, no rash. Neuro:  Strength and sensation are intact. Psych: Normal affect.  Accessory Clinical Findings    Recent Labs: 04/15/2024: B Natriuretic Peptide 203.1 04/16/2024: ALT 19; TSH 0.552 04/17/2024: BUN 11; Creatinine, Ser 0.74; Hemoglobin 8.6; Magnesium  2.0; Platelets 358; Potassium 3.9; Sodium 137   Recent Lipid Panel No results found for: "CHOL", "TRIG", "HDL",  "CHOLHDL", "VLDL", "LDLCALC", "LDLDIRECT"       ECG personally reviewed by me today- EKG Interpretation Date/Time:  Wednesday May 07 2024 14:32:47 EDT Ventricular Rate:  80 PR Interval:  152 QRS Duration:  84 QT Interval:  468 QTC Calculation: 539 R Axis:   -76  Text Interpretation: Normal sinus rhythm Left axis deviation Pulmonary disease pattern T wave abnormality, consider anterolateral ischemia Prolonged QT When compared with ECG of 16-Apr-2024 10:30, Premature atrial complexes are no longer Present QRS axis Shifted left T wave inversion now evident in Anterior leads QT has lengthened Confirmed by Lawana Pray [phone]) on 05/07/2024 3:52:55 PM   Echocardiogram 04/16/2024   IMPRESSIONS     1. Left ventricular ejection fraction, by estimation, is 60 to 65%. The  left ventricle has normal function. The left ventricle has no regional  wall motion abnormalities. Left ventricular diastolic parameters are  consistent with Grade II diastolic  dysfunction (pseudonormalization).   2. Right ventricular systolic function is normal. The right ventricular  size is normal. There is normal pulmonary artery systolic pressure. The  estimated right ventricular systolic pressure is 27.8 mmHg.   3. The mitral valve is normal in structure. Trivial mitral valve  regurgitation. No evidence of mitral stenosis.   4. The aortic valve is normal in structure. Aortic valve regurgitation is  not visualized. No aortic stenosis is present.   5. The inferior vena cava is dilated in size with <50% respiratory  variability, suggesting right atrial pressure of 15 mmHg.   FINDINGS   Left Ventricle: Left ventricular ejection fraction, by estimation, is 60  to 65%. The left ventricle has normal function. The left ventricle has no  regional wall motion abnormalities. The left ventricular internal cavity  size was normal in size. There is   no left ventricular hypertrophy. Left ventricular diastolic parameters   are consistent with Grade II diastolic dysfunction (pseudonormalization).   Right Ventricle: The right ventricular size is normal. No increase in  right ventricular wall thickness. Right ventricular systolic function is  normal. There is normal pulmonary artery systolic pressure. The tricuspid  regurgitant velocity is 1.79 m/s, and   with an assumed right atrial pressure of 15 mmHg, the estimated right  ventricular systolic pressure is 27.8 mmHg.   Left Atrium: Left atrial size was normal in size.   Right Atrium: Right atrial size was normal in size.   Pericardium: There is no evidence of pericardial effusion.   Mitral Valve: The mitral valve is normal in structure. Trivial mitral  valve regurgitation. No evidence of mitral valve stenosis.   Tricuspid Valve: The tricuspid valve is normal in structure. Tricuspid  valve regurgitation is not demonstrated. No evidence of tricuspid  stenosis.   Aortic Valve: The aortic valve is normal in structure. Aortic valve  regurgitation is not visualized. No aortic stenosis is present.   Pulmonic Valve: The pulmonic valve was normal in structure. Pulmonic valve  regurgitation is not visualized. No evidence of pulmonic stenosis.   Aorta: The aortic root is normal in size and structure.   Venous: The inferior vena cava is dilated in size with less than 50%  respiratory variability, suggesting right atrial pressure of 15 mmHg.   IAS/Shunts: No atrial level shunt detected by color flow Doppler.          Assessment & Plan   1.  Atrial fibrillation-heart rate today 80 .  Denies episodes of accelerated or irregular heart rate.  Reports compliance with apixaban .  Denies bleeding issues.  TSH normal on recent check.This patients CHA2DS2-VASc Score and unadjusted Ischemic Stroke Rate (% per year) is equal to 2.2 % stroke rate/year from a score of 2 Above score calculated as 1 point each if present [facility], Age if 65-74] Continue  apixaban  Avoid  triggers caffeine, chocolate, EtOH, dehydration etc. Stop amiodarone -prolonged QT Order 2-week cardiac monitor Order nuclear stress testing  HFpEF-weight today 177.4.  Euvolemic.  Slowly increasing physical activity.  Echocardiogram showed normal LVEF, G2 DD Heart healthy low-sodium diet Daily weights Slowly increase physical activity Elevate lower extremities when not active  GERD-denies acid reflux symptoms. GERD diet instructions Continue pantoprazole   COPD-breathing at baseline. Continue Trelegy Follows with PCP  Disposition: Follow-up with Dr. Rolm Clos or me in 3-4 months.   Chet Cota. Greydis Stlouis NP-C     05/07/2024, 4:00 PM [facility] [address] Office [phone] Fax [phone]    I spent 15 minutes examining this patient, reviewing medications, and using patient centered shared decision making involving their cardiac care.   I spent  20 minutes reviewing past medical history,  medications, and prior cardiac tests.

## 2024-05-07 ENCOUNTER — Ambulatory Visit: Attending: General Practice | Admitting: General Practice

## 2024-05-07 ENCOUNTER — Ambulatory Visit: Attending: General Practice

## 2024-05-07 ENCOUNTER — Encounter: Payer: Self-pay | Admitting: General Practice

## 2024-05-07 VITALS — BP 119/60 | HR 80 | Ht 66.0 in | Wt 177.4 lb

## 2024-05-07 DIAGNOSIS — K2100 Gastro-esophageal reflux disease with esophagitis, without bleeding: Secondary | ICD-10-CM

## 2024-05-07 DIAGNOSIS — I5030 Unspecified diastolic (congestive) heart failure: Secondary | ICD-10-CM

## 2024-05-07 DIAGNOSIS — I4891 Unspecified atrial fibrillation: Secondary | ICD-10-CM | POA: Diagnosis not present

## 2024-05-07 DIAGNOSIS — J449 Chronic obstructive pulmonary disease, unspecified: Secondary | ICD-10-CM | POA: Diagnosis not present

## 2024-05-07 NOTE — Progress Notes (Unsigned)
 Enrolled for [employer] to mail a ZIO XT long term holter monitor to the patients address on file.   Dr. Rennis Golden to read.

## 2024-05-07 NOTE — Patient Instructions (Addendum)
 Medication Instructions:  Stop Amiodarone  200 mg twice daily as directed  *If you need a refill on your cardiac medications before your next appointment, please call your pharmacy*  Lab Work: NONE ordered at this time of appointment    Testing/Procedures: Your physician has requested that you have a lexiscan myoview. For further information please visit [email or web address] Please follow instruction sheet, as given.   ZIO XT- Long Term Monitor Instructions  Your physician has requested you wear a ZIO patch monitor for 14 days.  This is a single patch monitor. [employer] supplies one patch monitor per enrollment. Additional stickers are not available. Please do not apply patch if you will be having a Nuclear Stress Test,  Echocardiogram, Cardiac CT, MRI, or Chest Xray during the period you would be wearing the  monitor. The patch cannot be worn during these tests. You cannot remove and re-apply the  ZIO XT patch monitor.  Your ZIO patch monitor will be mailed 3 day USPS to your address on file. It may take 3-5 days  to receive your monitor after you have been enrolled.  Once you have received your monitor, please review the enclosed instructions. Your monitor  has already been registered assigning a specific monitor serial # to you.  Billing and Patient Assistance Program Information  We have supplied [employer] with any of your insurance information on file for billing purposes. [employer] offers a sliding scale Patient Assistance Program for patients that do not have  insurance, or whose insurance does not completely cover the cost of the ZIO monitor.  You must apply for the Patient Assistance Program to qualify for this discounted rate.  To apply, please call [employer] at [phone], select option 4, select option 2, ask to apply for  Patient Assistance Program. Sanna Crystal will ask your household income, and how many people  are in your household. They will quote your out-of-pocket cost based  on that information.  [employer] will also be able to set up a 10-month, interest-free payment plan if needed.  Applying the monitor   Shave hair from upper left chest.  Hold abrader disc by orange tab. Rub abrader in 40 strokes over the upper left chest as  indicated in your monitor instructions.  Clean area with 4 enclosed alcohol pads. Let dry.  Apply patch as indicated in monitor instructions. Patch will be placed under collarbone on left  side of chest with arrow pointing upward.  Rub patch adhesive wings for 2 minutes. Remove white label marked "1". Remove the white  label marked "2". Rub patch adhesive wings for 2 additional minutes.  While looking in a mirror, press and release button in center of patch. A small green light will  flash 3-4 times. This will be your only indicator that the monitor has been turned on.  Do not shower for the first 24 hours. You may shower after the first 24 hours.  Press the button if you feel a symptom. You will hear a small click. Record Date, Time and  Symptom in the Patient Logbook.  When you are ready to remove the patch, follow instructions on the last 2 pages of Patient  Logbook. Stick patch monitor onto the last page of Patient Logbook.  Place Patient Logbook in the blue and white box. Use locking tab on box and tape box closed  securely. The blue and white box has prepaid postage on it. Please place it in the mailbox as  soon as possible. Your physician should have your test  results approximately 7 days after the  monitor has been mailed back to [facility].  Call [facility] Customer Care at [phone] if you have questions regarding  your ZIO XT patch monitor. Call them immediately if you see an orange light blinking on your  monitor.  If your monitor falls off in less than 4 days, contact our Monitor department at [phone].  If your monitor becomes loose or falls off after 4 days call [employer] at [phone] for  suggestions  on securing your monitor   Follow-Up: At [facility], you and your health needs are our priority.  As part of our continuing mission to provide you with exceptional heart care, our providers are all part of one team.  This team includes your primary Cardiologist (physician) and Advanced Practice Providers or APPs (Physician Assistants and Nurse Practitioners) who all work together to provide you with the care you need, when you need it.  Your next appointment:   3-4 month(s)  Provider:   Dr. Rolm Clos    We recommend signing up for the patient portal called "MyChart".  Sign up information is provided on this After Visit Summary.  MyChart is used to connect with patients for Virtual Visits (Telemedicine).  Patients are able to view lab/test results, encounter notes, upcoming appointments, etc.  Non-urgent messages can be sent to your provider as well.   To learn more about what you can do with MyChart, go to [email or web address].   Other Instructions Avoid triggers. Caffeine, chocolate, dehydration ect.   How to Prepare for Your Myoview Test (stress test):  1.Please do not take these medications before your test:  (please note if this is an exercise test pt should hold beta blocker prior) 2.Your remaining medications may be taken with water. 3.Nothing to eat or drink, except water, 4 hours prior to arrival time.  NO caffeine/decaffeinated products, or chocolate 12 hours prior to arrival. 4.Ladies, please do not wear dresses.  Skirts or pants are approprate, please wear a short sleeve shirt. 5.NO perfume, cologne or lotion 6.Wear comfortable walking shoes.  NO HEELS! 7.Total time is 3 to 4 hours; you may want to bring reading material for the waiting time. 8.Please report to [facility] for your test  What to expect after you arrive:  Once you arrive and check in for your appointment an IV will be started in your arm.  Then the Technoligist will inject a small amount of  radioactive tracer.  There will be a 1 hour waiting period after this injection.  A series of pictures will be taken of your heart following this waiting period.  You will be prepped for the stress portion of the test.  During the stress portion of your test you will either walk on a treadmill or receive a small, safe amount of radioactive tracer injected in your IV.  After the stress portion, there is a short rest period during which time your heart and blood pressure will be monitored.  After the short rest period the Technologist will begin your second set of pictures.  Your doctor will inform you of your test results within 7-10 business days.  In preparation for your appointment, medication and supplies will be purchased.  Appointment availability is limited, so if you need to cancel or reschedule please call the office at [phone] 24 hours in advance to avoid a cancellation fee of $100.00

## 2024-05-08 NOTE — Addendum Note (Signed)
 Encounter addended by: Yossef Gilkison K, PA-C on: 05/08/2024 5:41 PM  Actions taken: Delete clinical note

## 2024-05-09 ENCOUNTER — Other Ambulatory Visit: Payer: Self-pay

## 2024-05-09 DIAGNOSIS — Z01818 Encounter for other preprocedural examination: Secondary | ICD-10-CM

## 2024-05-09 NOTE — H&P (Shared)
 Chief Complaint: Anemia; iron overload - image guided bone marrow biopsy and aspiration   Referring Provider(s): Sonja Altadena   Supervising Physician: Creasie Doctor  Patient Status: [facility] - Out-pt  History of Present Illness: Vincent Marquez is a 73 y.o. male with history of COPD, hypertension, atrial fibrillation, and anemia.  Pt was recently admitted to the hospital from 04/15/24 to 04/17/24 due to new onset atrial fibrillation.  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 Maryalice Smaller of Hematology for outpatient follow up.  Dr. Maryalice Smaller referred him to interventional radiology for image guided bone marrow biopsy and aspiration to aid in further diagnostic workup.    *** Patient is Full Code  Past Medical History:  Diagnosis Date   Anemia    COPD (chronic obstructive pulmonary disease) (HCC)    GERD (gastroesophageal reflux disease)    Hypertension     Past Surgical History:  Procedure Laterality Date   COLONOSCOPY     MULTIPLE TOOTH EXTRACTIONS      Allergies: Patient has no known allergies.  Medications: Prior to Admission medications   Medication Sig Start Date End Date Taking? Authorizing Provider  albuterol  (PROVENTIL  HFA;VENTOLIN  HFA) 108 (90 Base) MCG/ACT inhaler Inhale 1-2 puffs into the lungs every 6 (six) hours as needed for wheezing or shortness of breath. 01/15/19   Ethlyn Herd, MD  apixaban  (ELIQUIS ) 5 MG TABS tablet Take 1 tablet (5 mg total) by mouth 2 (two) times daily. 04/17/24   Gonfa, Taye T, MD  Fluticasone -Umeclidin-Vilant 100-62.5-25 MCG/INH AEPB Inhale 1 puff into the lungs daily.    [provider]  pantoprazole  (PROTONIX ) 40 MG tablet Take 1 tablet (40 mg total) by mouth daily. 04/17/24   Theadore Finger, MD     Family History  Problem Relation Age of Onset   Cancer Mother    Cancer Sister        gastric cancer   Other Sister        Unknown stomach issues   Cancer Sister         gastric cancer   Other Sister        Stomach issues, possibly cancer   Colon cancer Neg Hx    Esophageal cancer Neg Hx    Rectal cancer Neg Hx    Stomach cancer Neg Hx     Social History   Socioeconomic History   Marital status: Single    Spouse name: Not on file   Number of children: 8   Years of education: Not on file   Highest education level: Not on file  Occupational History   Not on file  Tobacco Use   Smoking status: Every Day    Current packs/day: 1.00    Average packs/day: 1 pack/day for 50.0 years (50.0 ttl pk-yrs)    Types: Cigarettes   Smokeless tobacco: Never  Vaping Use   Vaping status: Never Used  Substance and Sexual Activity   Alcohol use: Yes    Comment: weekend drinker for a few beers   Drug use: No   Sexual activity: Not on file  Other Topics Concern   Not on file  Social History Narrative   Corporate investment banker    Social Drivers of Health   Financial Resource Strain: Not on file  Food Insecurity: No Food Insecurity (04/16/2024)   Hunger Vital Sign    Worried About Running Out of Food in the Last Year: Never  true    Ran Out of Food in the Last Year: Never true  Transportation Needs: No Transportation Needs (04/16/2024)   PRAPARE - Administrator, Civil Service (Medical): No    Lack of Transportation (Non-Medical): No  Physical Activity: Not on file  Stress: Not on file  Social Connections: Patient Declined (04/16/2024)   Social Connection and Isolation Panel [NHANES]    Frequency of Communication with Friends and Family: Patient declined    Frequency of Social Gatherings with Friends and Family: Patient declined    Attends Religious Services: Patient declined    Database administrator or Organizations: Patient declined    Attends Banker Meetings: Patient declined    Marital Status: Patient declined     Review of Systems: A 12 point ROS discussed and pertinent positives are indicated in the HPI above.  All other systems  are negative.  Review of Systems  Vital Signs: There were no vitals taken for this visit.  Advance Care Plan: The advanced care place/surrogate decision maker was discussed at the time of visit and the patient did not wish to discuss or was not able to name a surrogate decision maker or provide an advance care plan.  Physical Exam  Imaging: ECHOCARDIOGRAM COMPLETE Result Date: 04/16/2024    ECHOCARDIOGRAM REPORT   Patient Name:   Vincent Marquez Date of Exam: 04/16/2024 Medical Rec #:  [id number]     Height:       67.0 in Accession #:    [id number]    Weight:       175.7 lb Date of Birth:  April 08, 1951     BSA:          1.914 m Patient Age:    73 years      BP:           131/92 mmHg Patient Gender: M             HR:           76 bpm. Exam Location:  Inpatient Procedure: 2D Echo, Cardiac Doppler and Color Doppler (Both Spectral and Color            Flow Doppler were utilized during procedure). Indications:    Atrial Fibrillation  History:        Patient has no prior history of Echocardiogram examinations.  Sonographer:    Janette Medley Referring Phys: 8 ARSHAD N KAKRAKANDY IMPRESSIONS  1. Left ventricular ejection fraction, by estimation, is 60 to 65%. The left ventricle has normal function. The left ventricle has no regional wall motion abnormalities. Left ventricular diastolic parameters are consistent with Grade II diastolic dysfunction (pseudonormalization).  2. Right ventricular systolic function is normal. The right ventricular size is normal. There is normal pulmonary artery systolic pressure. The estimated right ventricular systolic pressure is 27.8 mmHg.  3. The mitral valve is normal in structure. Trivial mitral valve regurgitation. No evidence of mitral stenosis.  4. The aortic valve is normal in structure. Aortic valve regurgitation is not visualized. No aortic stenosis is present.  5. The inferior vena cava is dilated in size with <50% respiratory variability, suggesting right atrial pressure of  15 mmHg. FINDINGS  Left Ventricle: Left ventricular ejection fraction, by estimation, is 60 to 65%. The left ventricle has normal function. The left ventricle has no regional wall motion abnormalities. The left ventricular internal cavity size was normal in size. There is  no left ventricular hypertrophy. Left ventricular diastolic parameters are  consistent with Grade II diastolic dysfunction (pseudonormalization). Right Ventricle: The right ventricular size is normal. No increase in right ventricular wall thickness. Right ventricular systolic function is normal. There is normal pulmonary artery systolic pressure. The tricuspid regurgitant velocity is 1.79 m/s, and  with an assumed right atrial pressure of 15 mmHg, the estimated right ventricular systolic pressure is 27.8 mmHg. Left Atrium: Left atrial size was normal in size. Right Atrium: Right atrial size was normal in size. Pericardium: There is no evidence of pericardial effusion. Mitral Valve: The mitral valve is normal in structure. Trivial mitral valve regurgitation. No evidence of mitral valve stenosis. Tricuspid Valve: The tricuspid valve is normal in structure. Tricuspid valve regurgitation is not demonstrated. No evidence of tricuspid stenosis. Aortic Valve: The aortic valve is normal in structure. Aortic valve regurgitation is not visualized. No aortic stenosis is present. Pulmonic Valve: The pulmonic valve was normal in structure. Pulmonic valve regurgitation is not visualized. No evidence of pulmonic stenosis. Aorta: The aortic root is normal in size and structure. Venous: The inferior vena cava is dilated in size with less than 50% respiratory variability, suggesting right atrial pressure of 15 mmHg. IAS/Shunts: No atrial level shunt detected by color flow Doppler.  LEFT VENTRICLE PLAX 2D LVIDd:         4.80 cm   Diastology LVIDs:         2.90 cm   LV e' medial:    9.17 cm/s LV PW:         1.00 cm   LV E/e' medial:  10.0 LV IVS:        0.80 cm   LV  e' lateral:   8.55 cm/s LVOT diam:     2.10 cm   LV E/e' lateral: 10.7 LV SV:         73 LV SV Index:   38 LVOT Area:     3.46 cm  RIGHT VENTRICLE             IVC RV S prime:     16.00 cm/s  IVC diam: 2.80 cm TAPSE (M-mode): 2.4 cm LEFT ATRIUM             Index        RIGHT ATRIUM           Index LA Vol (A2C):   51.9 ml 27.12 ml/m  RA Area:     19.60 cm LA Vol (A4C):   43.4 ml 22.68 ml/m  RA Volume:   53.10 ml  27.74 ml/m LA Biplane Vol: 50.6 ml 26.44 ml/m  AORTIC VALVE LVOT Vmax:   113.00 cm/s LVOT Vmean:  71.100 cm/s LVOT VTI:    0.212 m  AORTA Ao Root diam: 3.40 cm Ao Asc diam:  3.50 cm MITRAL VALVE               TRICUSPID VALVE MV Area (PHT): 4.31 cm    TR Peak grad:   12.8 mmHg MV Decel Time: 176 msec    TR Vmax:        179.00 cm/s MV E velocity: 91.50 cm/s MV A velocity: 62.80 cm/s  SHUNTS MV E/A ratio:  1.46        Systemic VTI:  0.21 m                            Systemic Diam: 2.10 cm Dorothye Gathers MD Electronically signed by Dorothye Gathers MD Signature Date/Time: 04/16/2024/3:48:09 PM  Final    US  Abdomen Limited RUQ (LIVER/GB) Result Date: 04/16/2024 CLINICAL DATA:  Gallstone. EXAM: ULTRASOUND ABDOMEN LIMITED RIGHT UPPER QUADRANT COMPARISON:  CT AP from 04/15/2024 FINDINGS: Gallbladder: No gallstones or gallbladder wall thickening. Equivocal for sludge within the dependent portion of the gallbladder. No pericholecystic fluid. Common bile duct: Diameter: 2.2 mm Liver: No focal lesion identified. Within normal limits in parenchymal echogenicity. Portal vein is patent on color Doppler imaging with normal direction of blood flow towards the liver. Other: None. IMPRESSION: 1. No gallstones or gallbladder wall thickening. 2. Equivocal for sludge within the dependent portion of the gallbladder. Electronically Signed   By: Kimberley Penman M.D.   On: 04/16/2024 05:38   CT ABDOMEN PELVIS WO CONTRAST Result Date: 04/16/2024 CLINICAL DATA:  Acute nonlocalized abdominal pain EXAM: CT ABDOMEN AND PELVIS WITHOUT  CONTRAST TECHNIQUE: Multidetector CT imaging of the abdomen and pelvis was performed following the standard protocol without IV contrast. RADIATION DOSE REDUCTION: This exam was performed according to the departmental dose-optimization program which includes automated exposure control, adjustment of the mA and/or kV according to patient size and/or use of iterative reconstruction technique. COMPARISON:  MRI abdomen 11/15/2021 and CT abdomen 10/17/2021 FINDINGS: Lower chest: No acute abnormality. Hepatobiliary: Layering sludge in the gallbladder. No evidence of cholecystitis. No biliary dilation. Unremarkable noncontrast appearance of the liver. Pancreas: Unremarkable. Spleen: Unremarkable. Adrenals/Urinary Tract: Normal adrenal glands. No urinary calculi or hydronephrosis. Unremarkable bladder. Stomach/Bowel: No bowel obstruction or bowel wall thickening. Colonic diverticulosis without diverticulitis. Stomach is within normal limits. Appendix is normal. Vascular/Lymphatic: Advanced aortic atherosclerotic calcification. No lymphadenopathy. Reproductive: Prostate is unremarkable. Large left and moderate right hydroceles Other: No free intraperitoneal fluid or air. Musculoskeletal: No acute fracture. IMPRESSION: 1. Large left and moderate right hydroceles. 2. Cholelithiasis. 3. Aortic Atherosclerosis (ICD10-I70.0). Electronically Signed   By: Rozell Cornet M.D.   On: 04/16/2024 00:01   DG Chest Portable 1 View Result Date: 04/15/2024 CLINICAL DATA:  Respiratory distress. EXAM: PORTABLE CHEST 1 VIEW COMPARISON:  Radiographs 01/15/2019 and 03/27/2013.  CT 10/01/2023. FINDINGS: 1824 hours. The heart size and mediastinal contours are stable. Lower lung volumes with new patchy left basilar airspace disease. The right lung appears clear. No evidence of pneumothorax or significant pleural effusion. The bones appear unremarkable. IMPRESSION: New left basilar atelectasis or early infiltrate.  No edema. Electronically Signed    By: Elmon Hagedorn M.D.   On: 04/15/2024 18:42    Labs:  CBC: Recent Labs    01/29/24 0851 04/15/24 1818 04/15/24 1839 04/16/24 0411 04/17/24 0247  WBC 5.8 8.7  --  3.7* 4.6  HGB 9.4* 9.3* 11.9* 8.4* 8.6*  HCT 30.7* 29.0* 35.0* 26.3* 26.5*  PLT 334 388  --  329 358    COAGS: No results for input(s): "INR", "APTT" in the last 8760 hours.  BMP: Recent Labs    01/29/24 0851 04/15/24 1818 04/15/24 1839 04/16/24 0234 04/17/24 0247  NA 141 138 139 135 137  K 4.4 4.0 4.0 4.7 3.9  CL 104 101 98 102 101  CO2 33* 27  --  24 28  GLUCOSE 88 157* 161* 169* 115*  BUN 11 12 12 12 11   CALCIUM 9.0 8.9  --  8.2* 8.6*  CREATININE 0.71 0.90 0.80 0.82 0.74  GFRNONAA >60 >60  --  >60 >60    LIVER FUNCTION TESTS: Recent Labs    01/29/24 0851 04/15/24 1818 04/16/24 0234  BILITOT 1.4* 1.5* 0.7  AST 20 27 29   ALT 18 22  19  ALKPHOS 66 59 54  PROT 6.3* 6.4* 5.8*  ALBUMIN 3.9 3.5 3.3*    TUMOR MARKERS: No results for input(s): "AFPTM", "CEA", "CA199", "CHROMGRNA" in the last 8760 hours.  Assessment and Plan:  Pt is with anemia and iron overload scheduled for image guided bone marrow biopsy and aspiration 05/12/24.    Risks and benefits of image guided bone marrow biopsy and aspiration was discussed with the patient and/or patient's family including, but not limited to bleeding, infection, damage to adjacent structures or low yield requiring additional tests.  All of the questions were answered and there is agreement to proceed.  Consent signed and in chart.  Thank you for allowing our service to participate in Vincent Marquez 's care.  Electronically Signed: Pasty Bongo, PA-C   05/09/2024, 2:54 PM    I spent a total of  30 Minutes   in face to face in clinical consultation, greater than 50% of which was counseling/coordinating care for image guided bone marrow biopsy and aspiration.

## 2024-05-12 ENCOUNTER — Ambulatory Visit (HOSPITAL_COMMUNITY)
Admission: RE | Admit: 2024-05-12 | Discharge: 2024-05-12 | Disposition: A | Discharge status: Home / Self Care | Source: Ambulatory Visit | Attending: Hematology | Admitting: Hematology

## 2024-05-12 ENCOUNTER — Encounter (HOSPITAL_COMMUNITY): Payer: Self-pay

## 2024-05-12 DIAGNOSIS — D649 Anemia, unspecified: Secondary | ICD-10-CM

## 2024-05-12 DIAGNOSIS — Z01818 Encounter for other preprocedural examination: Secondary | ICD-10-CM

## 2024-05-12 MED ORDER — SODIUM CHLORIDE 0.9 % IV SOLN: INTRAVENOUS | Status: DC

## 2024-05-12 NOTE — Progress Notes (Signed)
 Patient ID: Vincent Marquez, male   DOB: [birth year], 73 y.o.   MRN: [id number] Pt presented to [facility] today for bone marrow biopsy. During assessment pt stated that he ate breakfast this am; he wants IV conscious sedation for procedure so will therefore reschedule bone marrow biopsy. Pt will receive call from radiology scheduling regarding new appt time.

## 2024-05-13 ENCOUNTER — Ambulatory Visit (HOSPITAL_COMMUNITY)

## 2024-05-13 ENCOUNTER — Other Ambulatory Visit: Payer: Self-pay | Admitting: General Practice

## 2024-05-13 ENCOUNTER — Telehealth (HOSPITAL_COMMUNITY): Payer: Self-pay

## 2024-05-13 DIAGNOSIS — I4891 Unspecified atrial fibrillation: Secondary | ICD-10-CM

## 2024-05-13 NOTE — Telephone Encounter (Signed)
 Spoke with the patient, detailed instructions given. S.Vincent Marquez CCT

## 2024-05-16 ENCOUNTER — Ambulatory Visit (HOSPITAL_COMMUNITY)
Admission: RE | Admit: 2024-05-16 | Discharge: 2024-05-16 | Disposition: A | Discharge status: Home / Self Care | Source: Ambulatory Visit | Attending: Internal Medicine | Admitting: Internal Medicine

## 2024-05-16 DIAGNOSIS — I4891 Unspecified atrial fibrillation: Secondary | ICD-10-CM | POA: Diagnosis present

## 2024-05-16 LAB — MYOCARDIAL PERFUSION IMAGING
Base ST Depression (mm): 0 mm
LV dias vol: 89 mL (ref 62–150)
LV sys vol: 20 mL
Nuc Stress EF: 78 %
Peak HR: 99 {beats}/min
Rest HR: 69 {beats}/min
Rest Nuclear Isotope Dose: 9.9 mCi
SDS: 1
SRS: 1
SSS: 2
ST Depression (mm): 0 mm
Stress Nuclear Isotope Dose: 31.8 mCi
TID: 0.82

## 2024-05-16 MED ORDER — REGADENOSON 0.4 MG/5ML IV SOLN
0.4000 mg | Freq: Once | INTRAVENOUS | Status: AC
  Administered 2024-05-16: 0.4 mg via INTRAVENOUS

## 2024-05-16 MED ORDER — TECHNETIUM TC 99M TETROFOSMIN IV KIT
9.9000 | PACK | Freq: Once | INTRAVENOUS | Status: AC | PRN
  Administered 2024-05-16: 9.9 via INTRAVENOUS

## 2024-05-16 MED ORDER — REGADENOSON 0.4 MG/5ML IV SOLN
INTRAVENOUS | Status: AC
  Filled 2024-05-16: qty 5

## 2024-05-16 MED ORDER — TECHNETIUM TC 99M TETROFOSMIN IV KIT
31.8000 | PACK | Freq: Once | INTRAVENOUS | Status: AC | PRN
  Administered 2024-05-16: 31.8 via INTRAVENOUS

## 2024-05-18 ENCOUNTER — Ambulatory Visit: Payer: Self-pay | Admitting: General Practice

## 2024-05-20 ENCOUNTER — Other Ambulatory Visit: Payer: Self-pay | Admitting: Radiology

## 2024-05-20 NOTE — H&P (Incomplete)
 Chief Complaint: Anemia; iron overload - image guided bone marrow biopsy and aspiration   Referring Provider(s): Feng,Y  Supervising Physician: Babcock,G  Patient Status: [facility] - Out-pt  History of Present Illness: Vincent Marquez is a 73 y.o. male with history of COPD, hypertension, atrial fibrillation,GERD and anemia.  Pt was recently admitted to the hospital from 04/15/24 to 04/17/24 due to new onset atrial fibrillation.  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 Maryalice Smaller of Hematology for outpatient follow up.  Dr. Maryalice Smaller referred him to interventional radiology for image guided bone marrow biopsy and aspiration to aid in further diagnostic workup.    *** Patient is Full Code  Past Medical History:  Diagnosis Date   Anemia    COPD (chronic obstructive pulmonary disease) (HCC)    GERD (gastroesophageal reflux disease)    Hypertension     Past Surgical History:  Procedure Laterality Date   COLONOSCOPY     MULTIPLE TOOTH EXTRACTIONS      Allergies: Patient has no known allergies.  Medications: Prior to Admission medications   Medication Sig Start Date End Date Taking? Authorizing Provider  albuterol  (PROVENTIL  HFA;VENTOLIN  HFA) 108 (90 Base) MCG/ACT inhaler Inhale 1-2 puffs into the lungs every 6 (six) hours as needed for wheezing or shortness of breath. 01/15/19   Ethlyn Herd, MD  apixaban  (ELIQUIS ) 5 MG TABS tablet Take 1 tablet (5 mg total) by mouth 2 (two) times daily. 04/17/24   Gonfa, Taye T, MD  Fluticasone -Umeclidin-Vilant 100-62.5-25 MCG/INH AEPB Inhale 1 puff into the lungs daily.    [provider]  pantoprazole  (PROTONIX ) 40 MG tablet Take 1 tablet (40 mg total) by mouth daily. 04/17/24   Theadore Finger, MD     Family History  Problem Relation Age of Onset   Cancer Mother    Cancer Sister        gastric cancer   Other Sister        Unknown stomach issues   Cancer Sister        gastric  cancer   Other Sister        Stomach issues, possibly cancer   Colon cancer Neg Hx    Esophageal cancer Neg Hx    Rectal cancer Neg Hx    Stomach cancer Neg Hx     Social History   Socioeconomic History   Marital status: Single    Spouse name: Not on file   Number of children: 8   Years of education: Not on file   Highest education level: Not on file  Occupational History   Not on file  Tobacco Use   Smoking status: Every Day    Current packs/day: 1.00    Average packs/day: 1 pack/day for 50.0 years (50.0 ttl pk-yrs)    Types: Cigarettes   Smokeless tobacco: Never  Vaping Use   Vaping status: Never Used  Substance and Sexual Activity   Alcohol use: Yes    Comment: weekend drinker for a few beers   Drug use: No   Sexual activity: Not on file  Other Topics Concern   Not on file  Social History Narrative   Corporate investment banker    Social Drivers of Health   Financial Resource Strain: Not on file  Food Insecurity: No Food Insecurity (04/16/2024)   Hunger Vital Sign    Worried About Running Out of Food in the Last Year: Never true  Ran Out of Food in the Last Year: Never true  Transportation Needs: No Transportation Needs (04/16/2024)   PRAPARE - Administrator, Civil Service (Medical): No    Lack of Transportation (Non-Medical): No  Physical Activity: Not on file  Stress: Not on file  Social Connections: Patient Declined (04/16/2024)   Social Connection and Isolation Panel [NHANES]    Frequency of Communication with Friends and Family: Patient declined    Frequency of Social Gatherings with Friends and Family: Patient declined    Attends Religious Services: Patient declined    Database administrator or Organizations: Patient declined    Attends Banker Meetings: Patient declined    Marital Status: Patient declined       Review of Systems  Vital Signs:   Advance Care Plan: No documents on file  Physical Exam  Imaging: MYOCARDIAL  PERFUSION/CT RAD READ Result Date: 05/18/2024 CLINICAL DATA:  This over-read does not include interpretation of cardiac or coronary anatomy or pathology. The cardiac SPECT CT interpretation by the cardiologist is attached. COMPARISON:  CT October 01, 2023 FINDINGS: Vascular: Aortic atherosclerosis.  Coronary artery calcifications. Mediastinum/Nodes: No pathologically enlarged mediastinal or axillary lymph nodes. Lungs/Pleura: Scattered atelectasis/scarring. Motion degraded examination the lungs. Upper Abdomen: No acute abnormality. Musculoskeletal: Multilevel degenerative changes spine. IMPRESSION: 1. No acute extracardiac abnormality. 2. Aortic atherosclerosis. 3. Coronary artery calcifications. Aortic Atherosclerosis (ICD10-I70.0). Electronically Signed   By: Tama Fails M.D.   On: 05/18/2024 10:40   Myocardial Perfusion Imaging Result Date: 05/16/2024   The study is normal. The study is low risk.   LV perfusion is normal. There is no evidence of ischemia. There is no evidence of infarction.   Left ventricular function is normal. Nuclear stress EF: 78%. The left ventricular ejection fraction is hyperdynamic (>65%). End diastolic cavity size is normal. End systolic cavity size is normal.   CT images were obtained for attenuation correction and were examined for the presence of coronary calcium when appropriate.   Coronary calcium was present on the attenuation correction CT images. Moderate coronary calcifications were present. Coronary calcifications were present in the left anterior descending artery and left circumflex artery distribution(s).    Labs:  CBC: Recent Labs    01/29/24 0851 04/15/24 1818 04/15/24 1839 04/16/24 0411 04/17/24 0247  WBC 5.8 8.7  --  3.7* 4.6  HGB 9.4* 9.3* 11.9* 8.4* 8.6*  HCT 30.7* 29.0* 35.0* 26.3* 26.5*  PLT 334 388  --  329 358    COAGS: No results for input(s): "INR", "APTT" in the last 8760 hours.  BMP: Recent Labs    01/29/24 0851 04/15/24 1818  04/15/24 1839 04/16/24 0234 04/17/24 0247  NA 141 138 139 135 137  K 4.4 4.0 4.0 4.7 3.9  CL 104 101 98 102 101  CO2 33* 27  --  24 28  GLUCOSE 88 157* 161* 169* 115*  BUN 11 12 12 12 11   CALCIUM 9.0 8.9  --  8.2* 8.6*  CREATININE 0.71 0.90 0.80 0.82 0.74  GFRNONAA >60 >60  --  >60 >60    LIVER FUNCTION TESTS: Recent Labs    01/29/24 0851 04/15/24 1818 04/16/24 0234  BILITOT 1.4* 1.5* 0.7  AST 20 27 29   ALT 18 22 19   ALKPHOS 66 59 54  PROT 6.3* 6.4* 5.8*  ALBUMIN 3.9 3.5 3.3*    TUMOR MARKERS: No results for input(s): "AFPTM", "CEA", "CA199", "CHROMGRNA" in the last 8760 hours.  Assessment and Plan: Pt  with hx anemia and iron overload ;scheduled for image guided bone marrow biopsy and aspiration 05/21/24 for further evaluation.   Risks and benefits of image guided bone marrow biopsy and aspiration was discussed with the patient and/or patient's family including, but not limited to bleeding, infection, damage to adjacent structures or low yield requiring additional tests.   All of the questions were answered and there is agreement to proceed.   Consent signed and in chart.   Thank you for allowing our service to participate in Vincent Marquez 's care.  Electronically Signed: D. Honore Lux, PA-C   05/20/2024, 5:13 PM      I spent a total of  15 minutes   in face to face in clinical consultation, greater than 50% of which was counseling/coordinating care for image guided bone marrow biopsy

## 2024-05-21 ENCOUNTER — Ambulatory Visit (HOSPITAL_COMMUNITY)

## 2024-05-21 ENCOUNTER — Ambulatory Visit (HOSPITAL_COMMUNITY)
Admission: RE | Admit: 2024-05-21 | Discharge: 2024-05-21 | Disposition: A | Discharge status: Home / Self Care | Source: Ambulatory Visit | Attending: Hematology | Admitting: Hematology

## 2024-06-05 NOTE — Addendum Note (Signed)
 Encounter addended by: Augusta Lavanda BROCKS, PA-C on: 06/05/2024 10:58 AM  Actions taken: Delete clinical note

## 2024-07-02 ENCOUNTER — Other Ambulatory Visit: Payer: Self-pay | Admitting: Radiology

## 2024-07-02 DIAGNOSIS — D649 Anemia, unspecified: Secondary | ICD-10-CM

## 2024-07-02 NOTE — H&P (Signed)
 Chief Complaint: Anemia/iron overload; referred for image guided bone marrow biopsy for further evaluation  Referring Provider(s): Feng,Y  Supervising Physician: Karalee Beat  Patient Status: [facility] - Out-pt  History of Present Illness: Vincent Marquez is a 73 y.o. male smoker  with past medical history significant for COPD GERD, hypertension, as well as iron overload and chronic anemia.  He is scheduled today for image guided bone marrow biopsy for further evaluation.  *** Patient is Full Code  Past Medical History:  Diagnosis Date   Anemia    COPD (chronic obstructive pulmonary disease) (HCC)    GERD (gastroesophageal reflux disease)    Hypertension     Past Surgical History:  Procedure Laterality Date   COLONOSCOPY     MULTIPLE TOOTH EXTRACTIONS      Allergies: Patient has no known allergies.  Medications: Prior to Admission medications   Medication Sig Start Date End Date Taking? Authorizing Provider  albuterol  (PROVENTIL  HFA;VENTOLIN  HFA) 108 (90 Base) MCG/ACT inhaler Inhale 1-2 puffs into the lungs every 6 (six) hours as needed for wheezing or shortness of breath. 01/15/19   Van Knee, MD  apixaban  (ELIQUIS ) 5 MG TABS tablet Take 1 tablet (5 mg total) by mouth 2 (two) times daily. 04/17/24   Gonfa, Taye T, MD  Fluticasone -Umeclidin-Vilant 100-62.5-25 MCG/INH AEPB Inhale 1 puff into the lungs daily.    [provider]  pantoprazole  (PROTONIX ) 40 MG tablet Take 1 tablet (40 mg total) by mouth daily. 04/17/24   Kathrin Mignon DASEN, MD     Family History  Problem Relation Age of Onset   Cancer Mother    Cancer Sister        gastric cancer   Other Sister        Unknown stomach issues   Cancer Sister        gastric cancer   Other Sister        Stomach issues, possibly cancer   Colon cancer Neg Hx    Esophageal cancer Neg Hx    Rectal cancer Neg Hx    Stomach cancer Neg Hx     Social History   Socioeconomic History   Marital status: Single     Spouse name: Not on file   Number of children: 8   Years of education: Not on file   Highest education level: Not on file  Occupational History   Not on file  Tobacco Use   Smoking status: Every Day    Current packs/day: 1.00    Average packs/day: 1 pack/day for 50.0 years (50.0 ttl pk-yrs)    Types: Cigarettes   Smokeless tobacco: Never  Vaping Use   Vaping status: Never Used  Substance and Sexual Activity   Alcohol use: Yes    Comment: weekend drinker for a few beers   Drug use: No   Sexual activity: Not on file  Other Topics Concern   Not on file  Social History Narrative   Corporate investment banker    Social Drivers of Health   Financial Resource Strain: Not on file  Food Insecurity: No Food Insecurity (04/16/2024)   Hunger Vital Sign    Worried About Running Out of Food in the Last Year: Never true    Ran Out of Food in the Last Year: Never true  Transportation Needs: No Transportation Needs (04/16/2024)   PRAPARE - Administrator, Civil Service (Medical): No    Lack of Transportation (Non-Medical): No  Physical Activity: Not on file  Stress: Not on file  Social Connections: Patient Declined (04/16/2024)   Social Connection and Isolation Panel    Frequency of Communication with Friends and Family: Patient declined    Frequency of Social Gatherings with Friends and Family: Patient declined    Attends Religious Services: Patient declined    Database administrator or Organizations: Patient declined    Attends Banker Meetings: Patient declined    Marital Status: Patient declined      Review of Systems  Vital Signs:   Advance Care Plan: No documents on file    Physical Exam  Imaging: No results found.  Labs:  CBC: Recent Labs    01/29/24 0851 04/15/24 1818 04/15/24 1839 04/16/24 0411 04/17/24 0247  WBC 5.8 8.7  --  3.7* 4.6  HGB 9.4* 9.3* 11.9* 8.4* 8.6*  HCT 30.7* 29.0* 35.0* 26.3* 26.5*  PLT 334 388  --  329 358     COAGS: No results for input(s): INR, APTT in the last 8760 hours.  BMP: Recent Labs    01/29/24 0851 04/15/24 1818 04/15/24 1839 04/16/24 0234 04/17/24 0247  NA 141 138 139 135 137  K 4.4 4.0 4.0 4.7 3.9  CL 104 101 98 102 101  CO2 33* 27  --  24 28  GLUCOSE 88 157* 161* 169* 115*  BUN 11 12 12 12 11   CALCIUM 9.0 8.9  --  8.2* 8.6*  CREATININE 0.71 0.90 0.80 0.82 0.74  GFRNONAA >60 >60  --  >60 >60    LIVER FUNCTION TESTS: Recent Labs    01/29/24 0851 04/15/24 1818 04/16/24 0234  BILITOT 1.4* 1.5* 0.7  AST 20 27 29   ALT 18 22 19   ALKPHOS 66 59 54  PROT 6.3* 6.4* 5.8*  ALBUMIN 3.9 3.5 3.3*    TUMOR MARKERS: No results for input(s): AFPTM, CEA, CA199, CHROMGRNA in the last 8760 hours.  Assessment and Plan: 73 y.o. male smoker  with past medical history significant for COPD GERD, hypertension, as well as iron overload and chronic anemia.  He is scheduled today for image guided bone marrow biopsy for further evaluation.Risks and benefits of procedure was discussed with the patient  including, but not limited to bleeding, infection, damage to adjacent structures or low yield requiring additional tests.  All of the questions were answered and there is agreement to proceed.  Consent signed and in chart.    Thank you for allowing our service to participate in ARNALDO HEFFRON 's care.  Electronically Signed: D. Franky Rakers, PA-C   07/02/2024, 3:10 PM      I spent a total of  15 minutes   in face to face in clinical consultation, greater than 50% of which was counseling/coordinating care for image guided bone marrow biopsy

## 2024-07-03 ENCOUNTER — Ambulatory Visit (HOSPITAL_COMMUNITY)
Admission: RE | Admit: 2024-07-03 | Discharge: 2024-07-03 | Disposition: A | Discharge status: Home / Self Care | Source: Ambulatory Visit | Attending: Hematology

## 2024-07-03 ENCOUNTER — Ambulatory Visit (HOSPITAL_COMMUNITY)
Admission: RE | Admit: 2024-07-03 | Discharge: 2024-07-03 | Disposition: A | Discharge status: Home / Self Care | Source: Ambulatory Visit | Attending: Hematology | Admitting: Hematology

## 2024-07-03 ENCOUNTER — Encounter (HOSPITAL_COMMUNITY): Payer: Self-pay

## 2024-07-03 DIAGNOSIS — D649 Anemia, unspecified: Secondary | ICD-10-CM | POA: Insufficient documentation

## 2024-07-03 DIAGNOSIS — J449 Chronic obstructive pulmonary disease, unspecified: Secondary | ICD-10-CM | POA: Insufficient documentation

## 2024-07-03 DIAGNOSIS — I10 Essential (primary) hypertension: Secondary | ICD-10-CM | POA: Insufficient documentation

## 2024-07-03 DIAGNOSIS — D72819 Decreased white blood cell count, unspecified: Secondary | ICD-10-CM | POA: Diagnosis not present

## 2024-07-03 DIAGNOSIS — F17210 Nicotine dependence, cigarettes, uncomplicated: Secondary | ICD-10-CM | POA: Insufficient documentation

## 2024-07-03 DIAGNOSIS — Z01812 Encounter for preprocedural laboratory examination: Secondary | ICD-10-CM | POA: Diagnosis present

## 2024-07-03 DIAGNOSIS — K219 Gastro-esophageal reflux disease without esophagitis: Secondary | ICD-10-CM | POA: Diagnosis not present

## 2024-07-03 LAB — CBC WITH DIFFERENTIAL/PLATELET
Abs Immature Granulocytes: 0.02 K/uL (ref 0.00–0.07)
Basophils Absolute: 0 K/uL (ref 0.0–0.1)
Basophils Relative: 1 %
Eosinophils Absolute: 0.1 K/uL (ref 0.0–0.5)
Eosinophils Relative: 2 %
HCT: 27.2 % — ABNORMAL LOW (ref 39.0–52.0)
Hemoglobin: 8.2 g/dL — ABNORMAL LOW (ref 13.0–17.0)
Immature Granulocytes: 1 %
Lymphocytes Relative: 46 %
Lymphs Abs: 1.7 K/uL (ref 0.7–4.0)
MCH: 27.5 pg (ref 26.0–34.0)
MCHC: 30.1 g/dL (ref 30.0–36.0)
MCV: 91.3 fL (ref 80.0–100.0)
Monocytes Absolute: 0.8 K/uL (ref 0.1–1.0)
Monocytes Relative: 21 %
Neutro Abs: 1.1 K/uL — ABNORMAL LOW (ref 1.7–7.7)
Neutrophils Relative %: 29 %
Platelets: 305 K/uL (ref 150–400)
RBC: 2.98 MIL/uL — ABNORMAL LOW (ref 4.22–5.81)
RDW: 39.3 % — ABNORMAL HIGH (ref 11.5–15.5)
WBC: 3.8 K/uL — ABNORMAL LOW (ref 4.0–10.5)
nRBC: 4 % — ABNORMAL HIGH (ref 0.0–0.2)

## 2024-07-03 MED ORDER — MIDAZOLAM HCL 2 MG/2ML IJ SOLN
INTRAMUSCULAR | Status: AC | PRN
Start: 2024-07-03 — End: 2024-07-03
  Administered 2024-07-03: 1 mg via INTRAVENOUS

## 2024-07-03 MED ORDER — FENTANYL CITRATE (PF) 100 MCG/2ML IJ SOLN
INTRAMUSCULAR | Status: AC | PRN
  Administered 2024-07-03: 50 ug via INTRAVENOUS

## 2024-07-03 MED ORDER — FLUMAZENIL 0.5 MG/5ML IV SOLN
INTRAVENOUS | Status: AC
  Filled 2024-07-03: qty 5

## 2024-07-03 MED ORDER — MIDAZOLAM HCL 2 MG/2ML IJ SOLN
INTRAMUSCULAR | Status: AC
  Filled 2024-07-03: qty 2

## 2024-07-03 MED ORDER — FENTANYL CITRATE (PF) 100 MCG/2ML IJ SOLN
INTRAMUSCULAR | Status: AC
Start: 2024-07-03 — End: 2024-07-03
  Filled 2024-07-03: qty 2

## 2024-07-03 MED ORDER — NALOXONE HCL 0.4 MG/ML IJ SOLN
INTRAMUSCULAR | Status: AC
Start: 2024-07-03 — End: 2024-07-03
  Filled 2024-07-03: qty 1

## 2024-07-03 MED ORDER — SODIUM CHLORIDE 0.9 % IV SOLN: INTRAVENOUS | Status: DC

## 2024-07-03 NOTE — Discharge Instructions (Signed)
 For questions /concerns may call Interventional Radiology at [phone] or  Interventional Radiology clinic [phone]   You may remove your dressing and shower tomorrow afternoon                                                                                 Bone Marrow Aspiration and Bone Marrow Biopsy, Adult, Care After The following information offers guidance on how to care for yourself after your procedure. Your health care provider may also give you more specific instructions. If you have problems or questions, contact your health care provider. What can I expect after the procedure? After the procedure, it is common to have: Mild pain and tenderness. Swelling. Bruising. Follow these instructions at home: Incision care  Follow instructions from your health care provider about how to take care of the incision site. Make sure you: Wash your hands with soap and water for at least 20 seconds before and after you change your bandage (dressing). If soap and water are not available, use hand sanitizer. Change your dressing as told by your health care provider. Leave stitches (sutures), skin glue, or adhesive strips in place. These skin closures may need to stay in place for 2 weeks or longer. If adhesive strip edges start to loosen and curl up, you may trim the loose edges. Do not remove adhesive strips completely unless your health care provider tells you to do that. Check your incision site every day for signs of infection. Check for: More redness, swelling, or pain. Fluid or blood. Warmth. Pus or a bad smell. Activity Return to your normal activities as told by your health care provider. Ask your health care provider what activities are safe for you. Do not lift anything that is heavier than 10 lb (4.5 kg), or the limit that you are told, until your health care provider says that it is safe. If you were given a sedative during the procedure, it can affect you for several hours. Do  not drive or operate machinery until your health care provider says that it is safe. General instructions  Take over-the-counter and prescription medicines only as told by your health care provider. Do not take baths, swim, or use a hot tub until your health care provider approves. Ask your health care provider if you may take showers. You may only be allowed to take sponge baths. If directed, put ice on the affected area. To do this: Put ice in a plastic bag. Place a towel between your skin and the bag. Leave the ice on for 20 minutes, 2-3 times a day. If your skin turns bright red, remove the ice right away to prevent skin damage. The risk of skin damage is higher if you cannot feel pain, heat, or cold. Contact a health care provider if: You have signs of infection. Your pain is not controlled with medicine. You have cancer, and a temperature of 100.88F (38C) or higher. Get help right away if: You have a temperature of 101F (38.3C) or higher, or as told by your health care provider. You have bleeding from the incision site that cannot be controlled. This information is not intended to replace advice given to you by  your health care provider. Make sure you discuss any questions you have with your health care provider. Document Revised: 04/03/2022 Document Reviewed: 04/03/2022 Elsevier Patient Education  2024 Elsevier Inc.                                                                          Moderate Conscious Sedation, Adult, Care After After the procedure, it is common to have: Sleepiness for a few hours. Impaired judgment for a few hours. Trouble with balance. Nausea or vomiting if you eat too soon. Follow these instructions at home: For the time period you were told by your health care provider:  Rest. Do not participate in activities where you could fall or become injured. Do not drive or use machinery. Do not drink alcohol. Do not take sleeping pills or medicines that  cause drowsiness. Do not make important decisions or sign legal documents. Do not take care of children on your own. Eating and drinking Follow instructions from your health care provider about what you may eat and drink. Drink enough fluid to keep your urine pale yellow. If you vomit: Drink clear fluids slowly and in small amounts as you are able. Clear fluids include water, ice chips, low-calorie sports drinks, and fruit juice that has water added to it (diluted fruit juice). Eat light and bland foods in small amounts as you are able. These foods include bananas, applesauce, rice, lean meats, toast, and crackers. General instructions Take over-the-counter and prescription medicines only as told by your health care provider. Have a responsible adult stay with you for the time you are told. Do not use any products that contain nicotine or tobacco. These products include cigarettes, chewing tobacco, and vaping devices, such as e-cigarettes. If you need help quitting, ask your health care provider. Return to your normal activities as told by your health care provider. Ask your health care provider what activities are safe for you. Your health care provider may give you more instructions. Make sure you know what you can and cannot do. Contact a health care provider if: You are still sleepy or having trouble with balance after 24 hours. You feel light-headed. You vomit every time you eat or drink. You get a rash. You have a fever. You have redness or swelling around the IV site. Get help right away if: You have trouble breathing. You start to feel confused at home. These symptoms may be an emergency. Get help right away. Call 911. Do not wait to see if the symptoms will go away. Do not drive yourself to the hospital. This information is not intended to replace advice given to you by your health care provider. Make sure you discuss any questions you have with your health care  provider. Document Revised: 06/12/2022 Document Reviewed: 06/12/2022 Elsevier Patient Education  2024 [employer].

## 2024-07-03 NOTE — Procedures (Signed)
Interventional Radiology Procedure Note  Procedure: CT guided aspirate and core biopsy of right iliac bone Complications: None Recommendations: - Bedrest supine x 1 hrs - Hydrocodone PRN  Pain - Follow biopsy results  Signed,  Heath K. McCullough, MD   

## 2024-07-08 LAB — SURGICAL PATHOLOGY

## 2024-07-09 ENCOUNTER — Telehealth: Payer: Self-pay | Admitting: Hematology

## 2024-07-09 NOTE — Telephone Encounter (Signed)
 Scheduled appointment per staff message from Dr.Feng. Talked with the patient and he is aware of the made appointment.

## 2024-07-11 ENCOUNTER — Encounter (HOSPITAL_COMMUNITY): Payer: Self-pay | Admitting: Hematology

## 2024-07-16 DIAGNOSIS — D469 Myelodysplastic syndrome, unspecified: Secondary | ICD-10-CM | POA: Insufficient documentation

## 2024-07-16 NOTE — Assessment & Plan Note (Signed)
 myelodysplastic syndrome with ring sideroblasts  -diagnosed in 06/2024 from bone marrow biopsy, which showed hypercellular bone marrow with prominent erythroid hyperplasia with left shift and ring sideroblasts.  -pt presents with chronic moderate anemia since 2019, worse lately, iron overload on liver MRI in 2022 and elevated ferritin. HFE genetic mutation was negative -I recommend Luspatercept  injections

## 2024-07-17 ENCOUNTER — Inpatient Hospital Stay: Attending: Hematology | Admitting: Hematology

## 2024-07-17 VITALS — BP 110/60 | HR 90 | Temp 98.6°F | Resp 17 | Ht 66.0 in | Wt 176.6 lb

## 2024-07-17 DIAGNOSIS — D461 Refractory anemia with ring sideroblasts: Secondary | ICD-10-CM | POA: Insufficient documentation

## 2024-07-17 DIAGNOSIS — D469 Myelodysplastic syndrome, unspecified: Secondary | ICD-10-CM

## 2024-07-17 NOTE — Progress Notes (Signed)
 [facility]   [phone] [phone]   Clinic Follow up Note   Patient Care Team: Maree Leni Edyth DELENA, MD as PCP - General (Family Medicine) Mona Vinie BROCKS, MD as PCP - Cardiology (Cardiology)  Date of Service:  07/17/2024  CHIEF COMPLAINT: f/u of bone marrow biopsy result   CURRENT THERAPY:  Pending Luspatercept  injection  Oncology History   Myelodysplastic syndrome (HCC) myelodysplastic syndrome with ring sideroblasts  -diagnosed in 06/2024 from bone marrow biopsy, which showed hypercellular bone marrow with prominent erythroid hyperplasia with left shift and ring sideroblasts.  -pt presents with chronic moderate anemia since 2019, worse lately, iron overload on liver MRI in 2022 and elevated ferritin. HFE genetic mutation was negative -I recommend Luspatercept  injections    Assessment & Plan Myelodysplastic syndrome with ring sideroblasts (refractory anemia with ring sideroblasts) -Anemia is worsening with a hemoglobin level of 8.2 g/dL, causing significant fatigue and leg pain.  -Bone marrow biopsy confirms MDS with ring sideroblasts, characterized by iron deposits in bone marrow cells. This condition can potentially progress to leukemia. 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 - I recommend Reblozyl  injection to stimulate red blood cell production and maturation to reduce the need for blood transfusions. Goal: maintain hemoglobin levels above 9 g/dL, ideally 89-88 g/dL, to improve symptoms. - Discussed potential side effects: hypertension and thrombosis. Pt is already on Eliquis  [employer] approval for Reblozyl  injection. - Schedule first injection for next week. - Administer Reblozyl  injection every three weeks. - Monitor blood counts every three weeks. - Consider blood transfusion if hemoglobin drops below 8 g/dL. - Evaluate the need for iron-reducing medication  if iron levels remain high in the future.  Plan - I reviewed the bone marrow biopsy results with patient and his wife - I recommended starting Luspatercept  injection every 3 weeks, will start at 1 mg/kg, and titrate the dose as needed -f/u with third dose injection    SUMMARY OF ONCOLOGIC HISTORY: Oncology History  Myelodysplastic syndrome (HCC)  07/16/2024 Initial Diagnosis   Myelodysplastic syndrome (HCC)   07/24/2024 -  Chemotherapy   Patient is on Treatment Plan : MYELODYSPLASIA Luspatercept  q21d        Discussed the use of AI scribe software for clinical note transcription with the patient, who gave verbal consent to proceed.  History of Present Illness Vincent Marquez is a 73 year old male with anemia and myelodysplastic syndrome (MDS) who presents for follow-up.  He experiences significant fatigue and leg pain, which have worsened over the past couple of years. These symptoms affect his ability to walk short distances without stopping. His most recent hemoglobin level is 8.2 g/dL. He is not taking iron supplements but is taking vitamin D.     All other systems were reviewed with the patient and are negative.  MEDICAL HISTORY:  Past Medical History:  Diagnosis Date   Anemia    COPD (chronic obstructive pulmonary disease) (HCC)    GERD (gastroesophageal reflux disease)    Hypertension     SURGICAL HISTORY: Past Surgical History:  Procedure Laterality Date   COLONOSCOPY     MULTIPLE TOOTH EXTRACTIONS      I have reviewed the social history and family history with the patient and they are unchanged from previous note.  ALLERGIES:  has no known allergies.  MEDICATIONS:  Current Outpatient Medications  Medication Sig Dispense Refill   albuterol  (PROVENTIL  HFA;VENTOLIN  HFA)  108 (90 Base) MCG/ACT inhaler Inhale 1-2 puffs into the lungs every 6 (six) hours as needed for wheezing or shortness of breath. 1 Inhaler 0   apixaban  (ELIQUIS ) 5 MG TABS tablet Take 1 tablet (5  mg total) by mouth 2 (two) times daily. 180 tablet 0   Fluticasone -Umeclidin-Vilant 100-62.5-25 MCG/INH AEPB Inhale 1 puff into the lungs daily.     pantoprazole  (PROTONIX ) 40 MG tablet Take 1 tablet (40 mg total) by mouth daily. 90 tablet 0   No current facility-administered medications for this visit.    PHYSICAL EXAMINATION: ECOG PERFORMANCE STATUS: 1 - Symptomatic but completely ambulatory  Vitals:   07/17/24 0909  BP: 110/60  Pulse: 90  Resp: 17  Temp: 98.6 F (37 C)  SpO2: 97%   Wt Readings from Last 3 Encounters:  07/17/24 176 lb 9.6 oz (80.1 kg)  07/03/24 177 lb (80.3 kg)  05/16/24 177 lb (80.3 kg)     GENERAL:alert, no distress and comfortable SKIN: skin color, texture, turgor are normal, no rashes or significant lesions EYES: normal, Conjunctiva are pink and non-injected, sclera clear NECK: supple, thyroid normal size, non-tender, without nodularity LYMPH:  no palpable lymphadenopathy in the cervical, axillary  LUNGS: clear to auscultation and percussion with normal breathing effort HEART: regular rate & rhythm and no murmurs and no lower extremity edema ABDOMEN:abdomen soft, non-tender and normal bowel sounds Musculoskeletal:no cyanosis of digits and no clubbing  NEURO: alert & oriented x 3 with fluent speech, no focal motor/sensory deficits  Physical Exam    LABORATORY DATA:  I have reviewed the data as listed    Latest Ref Rng & Units 07/03/2024    9:10 AM 04/17/2024    2:47 AM 04/16/2024    4:11 AM  CBC  WBC 4.0 - 10.5 K/uL 3.8  4.6  3.7   Hemoglobin 13.0 - 17.0 g/dL 8.2  8.6  8.4   Hematocrit 39.0 - 52.0 % 27.2  26.5  26.3   Platelets 150 - 400 K/uL 305  358  329         Latest Ref Rng & Units 04/17/2024    2:47 AM 04/16/2024    2:34 AM 04/15/2024    6:39 PM  CMP  Glucose 70 - 99 mg/dL 884  830  838   BUN 8 - 23 mg/dL 11  12  12    Creatinine 0.61 - 1.24 mg/dL 9.25  9.17  9.19   Sodium 135 - 145 mmol/L 137  135  139   Potassium 3.5 - 5.1 mmol/L 3.9   4.7  4.0   Chloride 98 - 111 mmol/L 101  102  98   CO2 22 - 32 mmol/L 28  24    Calcium 8.9 - 10.3 mg/dL 8.6  8.2    Total Protein 6.5 - 8.1 g/dL  5.8    Total Bilirubin 0.0 - 1.2 mg/dL  0.7    Alkaline Phos 38 - 126 U/L  54    AST 15 - 41 U/L  29    ALT 0 - 44 U/L  19        RADIOGRAPHIC STUDIES: I have personally reviewed the radiological images as listed and agreed with the findings in the report. No results found.    Orders Placed This Encounter  Procedures   Consent Attestation for Oncology Treatment    The patient is informed of risks, benefits, side-effects of the prescribed oncology treatment. Potential short term and long term side effects and response rates  discussed. After a long discussion, the patient made informed decision to proceed.:   Yes   CBC with Differential (Cancer Center Only)    Standing Status:   Future    Expected Date:   07/24/2024    Expiration Date:   07/24/2025   CBC with Differential (Cancer Center Only)    Standing Status:   Future    Expected Date:   08/14/2024    Expiration Date:   08/14/2025   CBC with Differential (Cancer Center Only)    Standing Status:   Future    Expected Date:   09/04/2024    Expiration Date:   09/04/2025   PHYSICIAN COMMUNICATION ORDER    Order aspirin 81-325mg  OR Coumadin for thromboembolic prophylaxis via add orders. Target Coumadin INR = 2-3.   ONCBCN PHYSICIAN COMMUNICATION 1    **Dose Increases for Insufficient Response at Initiation of Treatment:  Not RBC transfusion-free after at least 2 consecutive doses (6 weeks) at the 1 mg/kg starting dose, increase the dose to 1.33 mg/kg every 3 weeks.  Not RBC transfusion-free after at least 2 consecutive doses (6 weeks) at 1.33 mg/kg increase the dose to 1.75 mg/kg every 3 weeks.  Discontinue treatment if no reduction in RBC transfusion burden after at least 3 consecutive doses (9 weeks) at 1.75 mg/kg.   ONCBCN PHYSICIAN COMMUNICATION 2    **Dose Modifications for Predose  Hemoglobin Levels or Rapid Hemoglobin Rise**  Predose hemoglobin is greater than or equal to 11.5 g/dL in the absence of transfusions. Interrupt treatment. Restart when the hemoglobin is no more than 11 g/dL.  Increase in hemoglobin greater than 2 g/dL within 3 weeks in the absence of transfusions: Current dose = 1.75 mg/kg, reduce dose to 1.33 mg/kg.  Current dose = 1.33 mg/kg, reduce dose to 1 mg/kg.  Current dose = 1 mg/kg, reduce dose to 0.8 mg/kg.  Current dose = 0.8 mg/kg reduce dose to 0.6 mg/kg. Current dose = 0.6 mg/kg, discontinue treatment.   Treatment conditions    Monitor blood pressure prior to each administration.  Manage new-onset hypertension or exacerbations of preexisting hypertension using anti-hypertensive agents.   All questions were answered. The patient knows to call the clinic with any problems, questions or concerns. No barriers to learning was detected. The total time spent in the appointment was 40 minutes, including review of chart and various tests results, discussions about plan of care and coordination of care plan     Onita Mattock, MD 07/17/2024

## 2024-07-18 ENCOUNTER — Encounter (HOSPITAL_COMMUNITY): Payer: Self-pay | Admitting: Hematology

## 2024-07-18 ENCOUNTER — Other Ambulatory Visit: Payer: Self-pay

## 2024-07-22 ENCOUNTER — Other Ambulatory Visit: Payer: Self-pay

## 2024-07-24 ENCOUNTER — Other Ambulatory Visit: Payer: Self-pay | Admitting: Nurse Practitioner

## 2024-07-24 ENCOUNTER — Inpatient Hospital Stay

## 2024-07-24 VITALS — BP 164/87 | HR 112 | Temp 98.5°F | Resp 24

## 2024-07-24 DIAGNOSIS — D469 Myelodysplastic syndrome, unspecified: Secondary | ICD-10-CM

## 2024-07-24 DIAGNOSIS — D461 Refractory anemia with ring sideroblasts: Secondary | ICD-10-CM | POA: Diagnosis not present

## 2024-07-24 LAB — CBC WITH DIFFERENTIAL (CANCER CENTER ONLY)
Abs Immature Granulocytes: 0.02 K/uL (ref 0.00–0.07)
Basophils Absolute: 0 K/uL (ref 0.0–0.1)
Basophils Relative: 0 %
Eosinophils Absolute: 0.1 K/uL (ref 0.0–0.5)
Eosinophils Relative: 2 %
HCT: 26.9 % — ABNORMAL LOW (ref 39.0–52.0)
Hemoglobin: 8.5 g/dL — ABNORMAL LOW (ref 13.0–17.0)
Immature Granulocytes: 0 %
Lymphocytes Relative: 36 %
Lymphs Abs: 2.3 K/uL (ref 0.7–4.0)
MCH: 28 pg (ref 26.0–34.0)
MCHC: 31.6 g/dL (ref 30.0–36.0)
MCV: 88.5 fL (ref 80.0–100.0)
Monocytes Absolute: 1.1 K/uL — ABNORMAL HIGH (ref 0.1–1.0)
Monocytes Relative: 18 %
Neutro Abs: 2.7 K/uL (ref 1.7–7.7)
Neutrophils Relative %: 44 %
Platelet Count: 312 K/uL (ref 150–400)
RBC: 3.04 MIL/uL — ABNORMAL LOW (ref 4.22–5.81)
RDW: 38.7 % — ABNORMAL HIGH (ref 11.5–15.5)
WBC Count: 6.3 K/uL (ref 4.0–10.5)
nRBC: 1.3 % — ABNORMAL HIGH (ref 0.0–0.2)

## 2024-07-24 MED ORDER — LUSPATERCEPT-AAMT 75 MG ~~LOC~~ SOLR
75.0000 mg | Freq: Once | SUBCUTANEOUS | Status: AC
  Administered 2024-07-24: 75 mg via SUBCUTANEOUS
  Filled 2024-07-24: qty 1.5

## 2024-07-24 NOTE — Progress Notes (Signed)
 Patient here for Reblozyl  injection.  Blood pressure 177/137, 171/93 and 164/87.  States he's not on any blood pressure medication.  Advised him to follow up with his PCP.  Secure Chatted Heather/NP and Devan/pharmacist. Powell states ok for the injection.  Wants patient to monitor blood pressure at home and to keep his appointment with his PCP on 08/08/24 to follow up with his blood pressure.  Patient verbalizes understanding.

## 2024-07-24 NOTE — Patient Instructions (Signed)
 Luspatercept Injection What is this medication? LUSPATERCEPT (lus PAT er sept) treats low levels of red blood cells (anemia) in the body in people with beta thalassemia or myelodysplastic syndromes. It works by helping the body make more red blood cells. This medicine may be used for other purposes; ask your health care provider or pharmacist if you have questions. COMMON BRAND NAME(S): REBLOZYL What should I tell my care team before I take this medication? They need to know if you have any of these conditions: Have had your spleen removed High blood pressure History of blood clots Tobacco use An unusual or allergic reaction to luspatercept, other medications, foods, dyes, or preservatives Pregnant or trying to get pregnant Breastfeeding How should I use this medication? This medication is injected under the skin. It is given by your care team in a hospital or clinic setting. Talk to your care team about the use of the medication in children. It is not approved for use in children. Overdosage: If you think you have taken too much of this medicine contact a poison control center or emergency room at once. NOTE: This medicine is only for you. Do not share this medicine with others. What if I miss a dose? Keep appointments for follow-up doses. It is important not to miss your dose. Call your care team if you are unable to keep an appointment. What may interact with this medication? Interactions are not expected. This list may not describe all possible interactions. Give your health care provider a list of all the medicines, herbs, non-prescription drugs, or dietary supplements you use. Also tell them if you smoke, drink alcohol, or use illegal drugs. Some items may interact with your medicine. What should I watch for while using this medication? Your condition will be monitored carefully while you are receiving this medication. You may need blood work done while you are taking this  medication. Talk to your care team if you may be pregnant. Serious birth defects can occur if you take this medication during pregnancy. Contraception is recommended while taking this medication. Your care team can help you find the option that works for you. Talk to your care team before breastfeeding. Changes to your treatment plan may be needed. What side effects may I notice from receiving this medication? Side effects that you should report to your care team as soon as possible: Allergic reactions--skin rash, itching, hives, swelling of the face, lips, tongue, or throat Blood clot--pain, swelling, or warmth in the leg, shortness of breath, chest pain Increase in blood pressure Severe back pain, numbness or weakness of the hands, arms, legs, or feet, loss of coordination, loss of bowel or bladder control Side effects that usually do not require medical attention (report these to your care team if they continue or are bothersome): Bone pain Dizziness Fatigue Headache Joint pain Muscle pain Stomach pain This list may not describe all possible side effects. Call your doctor for medical advice about side effects. You may report side effects to FDA at 1-800-FDA-1088. Where should I keep my medication? This medication is given in a hospital or clinic. It will not be stored at home. NOTE: This sheet is a summary. It may not cover all possible information. If you have questions about this medicine, talk to your doctor, pharmacist, or health care provider.  2024 Elsevier/Gold Standard (2023-04-27 00:00:00)

## 2024-08-14 ENCOUNTER — Inpatient Hospital Stay

## 2024-08-14 ENCOUNTER — Inpatient Hospital Stay: Attending: Hematology

## 2024-08-14 VITALS — BP 140/87 | HR 89 | Temp 98.5°F | Resp 19

## 2024-08-14 DIAGNOSIS — D469 Myelodysplastic syndrome, unspecified: Secondary | ICD-10-CM

## 2024-08-14 DIAGNOSIS — D461 Refractory anemia with ring sideroblasts: Secondary | ICD-10-CM | POA: Diagnosis present

## 2024-08-14 LAB — CBC WITH DIFFERENTIAL (CANCER CENTER ONLY)
Abs Immature Granulocytes: 0.01 K/uL (ref 0.00–0.07)
Basophils Absolute: 0 K/uL (ref 0.0–0.1)
Basophils Relative: 1 %
Eosinophils Absolute: 0.1 K/uL (ref 0.0–0.5)
Eosinophils Relative: 2 %
HCT: 30 % — ABNORMAL LOW (ref 39.0–52.0)
Hemoglobin: 9.6 g/dL — ABNORMAL LOW (ref 13.0–17.0)
Immature Granulocytes: 0 %
Lymphocytes Relative: 46 %
Lymphs Abs: 2.2 K/uL (ref 0.7–4.0)
MCH: 27.3 pg (ref 26.0–34.0)
MCHC: 32 g/dL (ref 30.0–36.0)
MCV: 85.2 fL (ref 80.0–100.0)
Monocytes Absolute: 1 K/uL (ref 0.1–1.0)
Monocytes Relative: 21 %
Neutro Abs: 1.4 K/uL — ABNORMAL LOW (ref 1.7–7.7)
Neutrophils Relative %: 30 %
Platelet Count: 356 K/uL (ref 150–400)
RBC: 3.52 MIL/uL — ABNORMAL LOW (ref 4.22–5.81)
RDW: 37.2 % — ABNORMAL HIGH (ref 11.5–15.5)
WBC Count: 4.7 K/uL (ref 4.0–10.5)
nRBC: 1.7 % — ABNORMAL HIGH (ref 0.0–0.2)

## 2024-08-14 MED ORDER — LUSPATERCEPT-AAMT 75 MG ~~LOC~~ SOLR
75.0000 mg | Freq: Once | SUBCUTANEOUS | Status: AC
  Administered 2024-08-14: 75 mg via SUBCUTANEOUS
  Filled 2024-08-14: qty 1.5

## 2024-08-24 NOTE — Progress Notes (Deleted)
 Cardiology Office Note   Date:  08/24/2024  ID:  Vincent Marquez, DOB March 02, 1951, MRN [id number] PCP: Maree Leni Edyth DELENA, MD  [facility] Providers Cardiologist:  Vinie JAYSON Maxcy, MD    History of Present Illness Vincent Marquez is a 73 y.o. male with a past medical history of atrial fibrillation with RVR, COPD, hypomagnesia, hyperglycemia, anemia, GERD, tobacco use, gastritis, and SOB here for follow-up appointment.  Patient was admitted to the hospital 04/16/2024 and discharged a day later.  Heart rate was noted to be in the 180s.  Tachypneic with breaths of 29.  Notably slightly hypotensive.  Chest x-ray showed left basilar atelectasis or early infiltrates.  Serial troponins were negative.  His BNP was 203.  Cardiology was consulted.  Started on amiodarone  and heparin  gtt.  Received cefazolin, doxycycline  for possible pneumonia.  Pneumonia was ultimately ruled out.  Underwent TTE which showed normal LVEF and G2 DD.  At the time of discharge he remained in sinus rhythm and was asymptomatic.  Was transition to amiodarone  p.o.  Plan was to continue 200 mg twice daily for 1 week followed by 200 mg daily and p.o. apixaban .  He was counseled on cessation of alcohol, tobacco, and marijuana.  Instructed to avoid NSAIDs.  He was last seen in our clinic in May of this year.  Denied further episodes of accelerated or irregular heartbeat.  He reported he has been taking his amiodarone  200 mg twice daily.  EKG showed a QTc prolongation of 539.  Case was reviewed with DOD.  Ultimately decided to stop amiodarone .  Ordered to monitor and nuclear stress test.  Encouraged to continue his apixaban  and plan for follow-up with Dr. Vernice in a few months.  Today, he***  ROS: ***  Studies Reviewed      Echocardiogram 04/16/2024     IMPRESSIONS     1. Left ventricular ejection fraction, by estimation, is 60 to 65%. The  left ventricle has normal function. The left ventricle has no regional  wall motion  abnormalities. Left ventricular diastolic parameters are  consistent with Grade II diastolic  dysfunction (pseudonormalization).   2. Right ventricular systolic function is normal. The right ventricular  size is normal. There is normal pulmonary artery systolic pressure. The  estimated right ventricular systolic pressure is 27.8 mmHg.   3. The mitral valve is normal in structure. Trivial mitral valve  regurgitation. No evidence of mitral stenosis.   4. The aortic valve is normal in structure. Aortic valve regurgitation is  not visualized. No aortic stenosis is present.   5. The inferior vena cava is dilated in size with <50% respiratory  variability, suggesting right atrial pressure of 15 mmHg.   FINDINGS   Left Ventricle: Left ventricular ejection fraction, by estimation, is 60  to 65%. The left ventricle has normal function. The left ventricle has no  regional wall motion abnormalities. The left ventricular internal cavity  size was normal in size. There is   no left ventricular hypertrophy. Left ventricular diastolic parameters  are consistent with Grade II diastolic dysfunction (pseudonormalization).   Right Ventricle: The right ventricular size is normal. No increase in  right ventricular wall thickness. Right ventricular systolic function is  normal. There is normal pulmonary artery systolic pressure. The tricuspid  regurgitant velocity is 1.79 m/s, and   with an assumed right atrial pressure of 15 mmHg, the estimated right  ventricular systolic pressure is 27.8 mmHg.   Left Atrium: Left atrial size was normal  in size.   Right Atrium: Right atrial size was normal in size.   Pericardium: There is no evidence of pericardial effusion.   Mitral Valve: The mitral valve is normal in structure. Trivial mitral  valve regurgitation. No evidence of mitral valve stenosis.   Tricuspid Valve: The tricuspid valve is normal in structure. Tricuspid  valve regurgitation is not  demonstrated. No evidence of tricuspid  stenosis.   Aortic Valve: The aortic valve is normal in structure. Aortic valve  regurgitation is not visualized. No aortic stenosis is present.   Pulmonic Valve: The pulmonic valve was normal in structure. Pulmonic valve  regurgitation is not visualized. No evidence of pulmonic stenosis.   Aorta: The aortic root is normal in size and structure.   Venous: The inferior vena cava is dilated in size with less than 50%  respiratory variability, suggesting right atrial pressure of 15 mmHg.   IAS/Shunts: No atrial level shunt detected by color flow Doppler.        Risk Assessment/Calculations {Does this patient have ATRIAL FIBRILLATION?:[phone]} No BP recorded.  {Refresh Note OR Click here to enter BP  :1}***       Physical Exam VS:  There were no vitals taken for this visit.       Wt Readings from Last 3 Encounters:  07/17/24 176 lb 9.6 oz (80.1 kg)  07/03/24 177 lb (80.3 kg)  05/16/24 177 lb (80.3 kg)    GEN: Well nourished, well developed in no acute distress NECK: No JVD; No carotid bruits CARDIAC: ***RRR, no murmurs, rubs, gallops RESPIRATORY:  Clear to auscultation without rales, wheezing or rhonchi  ABDOMEN: Soft, non-tender, non-distended EXTREMITIES:  No edema; No deformity   ASSESSMENT AND PLAN Atrial fibrillation HFpEF GERD COPD    {Are you ordering a CV Procedure (e.g. stress test, cath, DCCV, TEE, etc)?   Press F2        :[id number]}  Dispo: ***  Signed, Orren LOISE Fabry, PA-C

## 2024-08-25 ENCOUNTER — Ambulatory Visit: Attending: Physician Assistant | Admitting: Physician Assistant

## 2024-08-25 DIAGNOSIS — I5030 Unspecified diastolic (congestive) heart failure: Secondary | ICD-10-CM

## 2024-08-25 DIAGNOSIS — J449 Chronic obstructive pulmonary disease, unspecified: Secondary | ICD-10-CM

## 2024-08-25 DIAGNOSIS — I4891 Unspecified atrial fibrillation: Secondary | ICD-10-CM

## 2024-08-25 DIAGNOSIS — K2100 Gastro-esophageal reflux disease with esophagitis, without bleeding: Secondary | ICD-10-CM

## 2024-09-03 NOTE — Assessment & Plan Note (Deleted)
 myelodysplastic syndrome with ring sideroblasts  -diagnosed in 06/2024 from bone marrow biopsy, which showed hypercellular bone marrow with prominent erythroid hyperplasia with left shift and ring sideroblasts.  -pt presents with chronic moderate anemia since 2019, worse lately, iron overload on liver MRI in 2022 and elevated ferritin. HFE genetic mutation was negative -I recommend Luspatercept  injections, he started on 07/24/2024

## 2024-09-04 ENCOUNTER — Telehealth: Payer: Self-pay

## 2024-09-04 ENCOUNTER — Telehealth: Payer: Self-pay | Admitting: Hematology

## 2024-09-04 ENCOUNTER — Inpatient Hospital Stay: Admitting: Hematology

## 2024-09-04 ENCOUNTER — Inpatient Hospital Stay

## 2024-09-04 DIAGNOSIS — D469 Myelodysplastic syndrome, unspecified: Secondary | ICD-10-CM

## 2024-09-04 NOTE — Telephone Encounter (Signed)
 Patient did not arrive to his scheduled appointments today. Attempted to contact the patient via telephone call. Unable to reach the patient. LVM to contact the facility to reschedule.

## 2024-09-04 NOTE — Telephone Encounter (Signed)
 Vincent Marquez stated that he had transportation issues and he has re-scheduled his appointments.

## 2024-09-05 ENCOUNTER — Other Ambulatory Visit: Payer: Self-pay

## 2024-09-09 NOTE — Assessment & Plan Note (Signed)
 myelodysplastic syndrome with ring sideroblasts  -diagnosed in 06/2024 from bone marrow biopsy, which showed hypercellular bone marrow with prominent erythroid hyperplasia with left shift and ring sideroblasts.  -pt presents with chronic moderate anemia since 2019, worse lately, iron overload on liver MRI in 2022 and elevated ferritin. HFE genetic mutation was negative -I recommend Luspatercept  injections, he started on 07/24/2024

## 2024-09-10 ENCOUNTER — Inpatient Hospital Stay (HOSPITAL_BASED_OUTPATIENT_CLINIC_OR_DEPARTMENT_OTHER): Admitting: Hematology

## 2024-09-10 ENCOUNTER — Inpatient Hospital Stay

## 2024-09-10 ENCOUNTER — Inpatient Hospital Stay: Attending: Hematology

## 2024-09-10 VITALS — BP 160/80 | HR 88 | Temp 98.3°F | Resp 18 | Ht 66.0 in | Wt 170.5 lb

## 2024-09-10 DIAGNOSIS — D469 Myelodysplastic syndrome, unspecified: Secondary | ICD-10-CM | POA: Diagnosis not present

## 2024-09-10 DIAGNOSIS — I10 Essential (primary) hypertension: Secondary | ICD-10-CM

## 2024-09-10 DIAGNOSIS — D461 Refractory anemia with ring sideroblasts: Secondary | ICD-10-CM | POA: Insufficient documentation

## 2024-09-10 LAB — CBC WITH DIFFERENTIAL (CANCER CENTER ONLY)
Abs Immature Granulocytes: 0.01 K/uL (ref 0.00–0.07)
Basophils Absolute: 0 K/uL (ref 0.0–0.1)
Basophils Relative: 0 %
Eosinophils Absolute: 0.1 K/uL (ref 0.0–0.5)
Eosinophils Relative: 3 %
HCT: 32.4 % — ABNORMAL LOW (ref 39.0–52.0)
Hemoglobin: 10.5 g/dL — ABNORMAL LOW (ref 13.0–17.0)
Immature Granulocytes: 0 %
Lymphocytes Relative: 43 %
Lymphs Abs: 1.5 K/uL (ref 0.7–4.0)
MCH: 27 pg (ref 26.0–34.0)
MCHC: 32.4 g/dL (ref 30.0–36.0)
MCV: 83.3 fL (ref 80.0–100.0)
Monocytes Absolute: 0.8 K/uL (ref 0.1–1.0)
Monocytes Relative: 23 %
Neutro Abs: 1.1 K/uL — ABNORMAL LOW (ref 1.7–7.7)
Neutrophils Relative %: 31 %
Platelet Count: 352 K/uL (ref 150–400)
RBC: 3.89 MIL/uL — ABNORMAL LOW (ref 4.22–5.81)
RDW: 36.5 % — ABNORMAL HIGH (ref 11.5–15.5)
WBC Count: 3.4 K/uL — ABNORMAL LOW (ref 4.0–10.5)
nRBC: 2.1 % — ABNORMAL HIGH (ref 0.0–0.2)

## 2024-09-10 MED ORDER — CLONIDINE HCL 0.1 MG PO TABS: 0.2000 mg | ORAL_TABLET | Freq: Once | ORAL | Status: DC

## 2024-09-10 MED ORDER — CLONIDINE HCL 0.1 MG PO TABS
0.2000 mg | ORAL_TABLET | Freq: Once | ORAL | Status: AC
  Administered 2024-09-10: 0.2 mg via ORAL
  Filled 2024-09-10: qty 2

## 2024-09-10 NOTE — Patient Instructions (Signed)
 Hypertension, Adult High blood pressure (hypertension) is when the force of blood pumping through the arteries is too strong. The arteries are the blood vessels that carry blood from the heart throughout the body. Hypertension forces the heart to work harder to pump blood and may cause arteries to become narrow or stiff. Untreated or uncontrolled hypertension can lead to a heart attack, heart failure, a stroke, kidney disease, and other problems. A blood pressure reading consists of a higher number over a lower number. Ideally, your blood pressure should be below 120/80. The first ("top") number is called the systolic pressure. It is a measure of the pressure in your arteries as your heart beats. The second ("bottom") number is called the diastolic pressure. It is a measure of the pressure in your arteries as the heart relaxes. What are the causes? The exact cause of this condition is not known. There are some conditions that result in high blood pressure. What increases the risk? Certain factors may make you more likely to develop high blood pressure. Some of these risk factors are under your control, including: Smoking. Not getting enough exercise or physical activity. Being overweight. Having too much fat, sugar, calories, or salt (sodium) in your diet. Drinking too much alcohol. Other risk factors include: Having a personal history of heart disease, diabetes, high cholesterol, or kidney disease. Stress. Having a family history of high blood pressure and high cholesterol. Having obstructive sleep apnea. Age. The risk increases with age. What are the signs or symptoms? High blood pressure may not cause symptoms. Very high blood pressure (hypertensive crisis) may cause: Headache. Fast or irregular heartbeats (palpitations). Shortness of breath. Nosebleed. Nausea and vomiting. Vision changes. Severe chest pain, dizziness, and seizures. How is this diagnosed? This condition is diagnosed by  measuring your blood pressure while you are seated, with your arm resting on a flat surface, your legs uncrossed, and your feet flat on the floor. The cuff of the blood pressure monitor will be placed directly against the skin of your upper arm at the level of your heart. Blood pressure should be measured at least twice using the same arm. Certain conditions can cause a difference in blood pressure between your right and left arms. If you have a high blood pressure reading during one visit or you have normal blood pressure with other risk factors, you may be asked to: Return on a different day to have your blood pressure checked again. Monitor your blood pressure at home for 1 week or longer. If you are diagnosed with hypertension, you may have other blood or imaging tests to help your health care provider understand your overall risk for other conditions. How is this treated? This condition is treated by making healthy lifestyle changes, such as eating healthy foods, exercising more, and reducing your alcohol intake. You may be referred for counseling on a healthy diet and physical activity. Your health care provider may prescribe medicine if lifestyle changes are not enough to get your blood pressure under control and if: Your systolic blood pressure is above 130. Your diastolic blood pressure is above 80. Your personal target blood pressure may vary depending on your medical conditions, your age, and other factors. Follow these instructions at home: Eating and drinking  Eat a diet that is high in fiber and potassium, and low in sodium, added sugar, and fat. An example of this eating plan is called the DASH diet. DASH stands for Dietary Approaches to Stop Hypertension. To eat this way: Eat  plenty of fresh fruits and vegetables. Try to fill one half of your plate at each meal with fruits and vegetables. Eat whole grains, such as whole-wheat pasta, brown rice, or whole-grain bread. Fill about one  fourth of your plate with whole grains. Eat or drink low-fat dairy products, such as skim milk or low-fat yogurt. Avoid fatty cuts of meat, processed or cured meats, and poultry with skin. Fill about one fourth of your plate with lean proteins, such as fish, chicken without skin, beans, eggs, or tofu. Avoid pre-made and processed foods. These tend to be higher in sodium, added sugar, and fat. Reduce your daily sodium intake. Many people with hypertension should eat less than 1,500 mg of sodium a day. Do not drink alcohol if: Your health care provider tells you not to drink. You are pregnant, may be pregnant, or are planning to become pregnant. If you drink alcohol: Limit how much you have to: 0-1 drink a day for women. 0-2 drinks a day for men. Know how much alcohol is in your drink. In the [place] one drink equals one 12 oz bottle of beer (355 mL), one 5 oz glass of wine (148 mL), or one 1 oz glass of hard liquor (44 mL). Lifestyle  Work with your health care provider to maintain a healthy body weight or to lose weight. Ask what an ideal weight is for you. Get at least 30 minutes of exercise that causes your heart to beat faster (aerobic exercise) most days of the week. Activities may include walking, swimming, or biking. Include exercise to strengthen your muscles (resistance exercise), such as Pilates or lifting weights, as part of your weekly exercise routine. Try to do these types of exercises for 30 minutes at least 3 days a week. Do not use any products that contain nicotine or tobacco. These products include cigarettes, chewing tobacco, and vaping devices, such as e-cigarettes. If you need help quitting, ask your health care provider. Monitor your blood pressure at home as told by your health care provider. Keep all follow-up visits. This is important. Medicines Take over-the-counter and prescription medicines only as told by your health care provider. Follow directions carefully. Blood  pressure medicines must be taken as prescribed. Do not skip doses of blood pressure medicine. Doing this puts you at risk for problems and can make the medicine less effective. Ask your health care provider about side effects or reactions to medicines that you should watch for. Contact a health care provider if you: Think you are having a reaction to a medicine you are taking. Have headaches that keep coming back (recurring). Feel dizzy. Have swelling in your ankles. Have trouble with your vision. Get help right away if you: Develop a severe headache or confusion. Have unusual weakness or numbness. Feel faint. Have severe pain in your chest or abdomen. Vomit repeatedly. Have trouble breathing. These symptoms may be an emergency. Get help right away. Call 911. Do not wait to see if the symptoms will go away. Do not drive yourself to the hospital. Summary Hypertension is when the force of blood pumping through your arteries is too strong. If this condition is not controlled, it may put you at risk for serious complications. Your personal target blood pressure may vary depending on your medical conditions, your age, and other factors. For most people, a normal blood pressure is less than 120/80. Hypertension is treated with lifestyle changes, medicines, or a combination of both. Lifestyle changes include losing weight, eating a healthy,  low-sodium diet, exercising more, and limiting alcohol. This information is not intended to replace advice given to you by your health care provider. Make sure you discuss any questions you have with your health care provider. Document Revised: 10/04/2021 Document Reviewed: 10/04/2021 Elsevier Patient Education  2024 [employer].

## 2024-09-10 NOTE — Progress Notes (Signed)
 Per Dr. Lanny hold pt's Rebozyl today d/t hypertension.  Pt will f/u with PCP regarding his hypertension management.  Clonidine 0.2mg  given today in clinic w/VS retake to be done w/in post medication administration (see VS flowsheet).  Dr. Lanny made aware of pt's VS and Dr. Lanny agreed to pt being d/c home.  Pt called PCP while waiting for clonidine to be absorbed.  Pt and girlfriend left to go to [facility] to pick-up refill of pt's BP medications.

## 2024-09-13 ENCOUNTER — Encounter: Payer: Self-pay | Admitting: Hematology

## 2024-09-13 NOTE — Progress Notes (Signed)
 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  -pt presents with chronic moderate anemia since 2019, worse lately, iron overload on liver MRI in 2022 and elevated ferritin. HFE genetic mutation was negative -I recommend Luspatercept  injections, he started on 07/24/2024  Assessment & Plan Hypertension with associated dizziness Hypertension with associated dizziness, likely exacerbated by missed doses of antihypertensive medication. Blood pressure is significantly elevated, and dizziness is present upon waking and sitting up. He has not been consistent with taking prescribed blood pressure medication and is unsure of the medication details. High blood pressure poses a risk of stroke, and the current symptoms necessitate immediate management. - Administer one dose of blood pressure medication in the office - Monitor blood pressure in 30 minutes - Call in a prescription for blood pressure medication if primary care cannot see him today or tomorrow - Advise follow-up with primary care physician to manage hypertension - Instruct to go to the emergency room if symptoms worsen, such as increased dizziness or new symptoms like headache  Myelodysplastic syndrome Myelodysplastic syndrome with improved anemia following two Luspatercept  injections. Hemoglobin level is 10.5, indicating a positive response to treatment. However, the current high  blood pressure contraindicates further injections at this time. - Cancel today's injection due to high blood pressure - Schedule next injection in two weeks, contingent on blood pressure control  Plan - Labs reviewed, his anemia has frequently improved, hemoglobin 10.5 - Total hypotension and dizziness, will hold Luspatercept  injections today and restart in 2 weeks - Follow-up in 2 months.     SUMMARY OF ONCOLOGIC HISTORY: Oncology History  Myelodysplastic syndrome (HCC)  07/16/2024 Initial Diagnosis   Myelodysplastic syndrome (HCC)   07/24/2024 -  Chemotherapy   Patient is on Treatment Plan : MYELODYSPLASIA Luspatercept  q21d        Discussed the use of AI scribe software for clinical note transcription with the patient, who gave verbal consent to proceed.  History of Present Illness Vincent Marquez is a 73 year old male with myelodysplastic syndrome who presents for follow-up.  He experiences persistent dizziness, particularly when waking up and sitting up in bed, without accompanying headaches. He has received two injections for myelodysplastic syndrome, with a current hemoglobin level of 10.5 g/dL. He has not noticed any increase in blood pressure since starting the injections.     All other systems were reviewed with the patient and are negative.  MEDICAL HISTORY:  Past Medical History:  Diagnosis Date   Anemia    COPD (chronic obstructive pulmonary disease) (HCC)    GERD (gastroesophageal reflux disease)    Hypertension     SURGICAL HISTORY: Past Surgical History:  Procedure Laterality Date   COLONOSCOPY     MULTIPLE TOOTH EXTRACTIONS      I have reviewed the social history and family history with the patient and they are unchanged from previous note.  ALLERGIES:  has no known allergies.  MEDICATIONS:  Current Outpatient Medications  Medication Sig Dispense  Refill   albuterol  (PROVENTIL  HFA;VENTOLIN  HFA) 108 (90 Base) MCG/ACT inhaler Inhale 1-2 puffs into the  lungs every 6 (six) hours as needed for wheezing or shortness of breath. 1 Inhaler 0   apixaban  (ELIQUIS ) 5 MG TABS tablet Take 1 tablet (5 mg total) by mouth 2 (two) times daily. 180 tablet 0   Fluticasone -Umeclidin-Vilant 100-62.5-25 MCG/INH AEPB Inhale 1 puff into the lungs daily.     pantoprazole  (PROTONIX ) 40 MG tablet Take 1 tablet (40 mg total) by mouth daily. 90 tablet 0   No current facility-administered medications for this visit.    PHYSICAL EXAMINATION: ECOG PERFORMANCE STATUS: 1 - Symptomatic but completely ambulatory  Vitals:   09/10/24 1446 09/10/24 1447  BP: (!) 148/90 (!) 160/80  Pulse:    Resp:    Temp:    SpO2:     Wt Readings from Last 3 Encounters:  09/10/24 170 lb 8 oz (77.3 kg)  07/17/24 176 lb 9.6 oz (80.1 kg)  07/03/24 177 lb (80.3 kg)     GENERAL:alert, no distress and comfortable SKIN: skin color, texture, turgor are normal, no rashes or significant lesions Musculoskeletal:no cyanosis of digits and no clubbing  NEURO: alert & oriented x 3 with fluent speech, no focal motor/sensory deficits  Physical Exam    LABORATORY DATA:  I have reviewed the data as listed    Latest Ref Rng & Units 09/10/2024   12:51 PM 08/14/2024    8:54 AM 07/24/2024    9:44 AM  CBC  WBC 4.0 - 10.5 K/uL 3.4  4.7  6.3   Hemoglobin 13.0 - 17.0 g/dL 89.4  9.6  8.5   Hematocrit 39.0 - 52.0 % 32.4  30.0  26.9   Platelets 150 - 400 K/uL 352  356  312         Latest Ref Rng & Units 04/17/2024    2:47 AM 04/16/2024    2:34 AM 04/15/2024    6:39 PM  CMP  Glucose 70 - 99 mg/dL 884  830  838   BUN 8 - 23 mg/dL 11  12  12    Creatinine 0.61 - 1.24 mg/dL 9.25  9.17  9.19   Sodium 135 - 145 mmol/L 137  135  139   Potassium 3.5 - 5.1 mmol/L 3.9  4.7  4.0   Chloride 98 - 111 mmol/L 101  102  98   CO2 22 - 32 mmol/L 28  24    Calcium 8.9 - 10.3 mg/dL 8.6  8.2    Total Protein 6.5 - 8.1 g/dL  5.8    Total Bilirubin 0.0 - 1.2 mg/dL  0.7    Alkaline Phos 38 - 126 U/L  54    AST 15 -  41 U/L  29    ALT 0 - 44 U/L  19        RADIOGRAPHIC STUDIES: I have personally reviewed the radiological images as listed and agreed with the findings in the report. No results found.    No orders of the defined types were placed in this encounter.  All questions were answered. The patient knows to call the clinic with any problems, questions or concerns. No barriers to learning was detected. The total time spent in the appointment was 15 minutes, including review of chart and various tests results, discussions about plan of care and coordination of care plan     Onita Mattock, MD 09/10/2024
